# Patient Record
Sex: Female | Born: 1970 | Race: White | Hispanic: No | Marital: Single | Smoking: Never smoker
Health system: Southern US, Community
[De-identification: ages and names within clinical notes are randomized; demographics above are authoritative.]

## PROBLEM LIST (undated history)

## (undated) DIAGNOSIS — R5381 Other malaise: Secondary | ICD-10-CM

## (undated) DIAGNOSIS — N63 Unspecified lump in unspecified breast: Secondary | ICD-10-CM

## (undated) DIAGNOSIS — T7840XA Allergy, unspecified, initial encounter: Secondary | ICD-10-CM

## (undated) DIAGNOSIS — G43909 Migraine, unspecified, not intractable, without status migrainosus: Secondary | ICD-10-CM

## (undated) DIAGNOSIS — E559 Vitamin D deficiency, unspecified: Secondary | ICD-10-CM

## (undated) DIAGNOSIS — R519 Headache, unspecified: Secondary | ICD-10-CM

## (undated) DIAGNOSIS — L309 Dermatitis, unspecified: Secondary | ICD-10-CM

## (undated) DIAGNOSIS — F329 Major depressive disorder, single episode, unspecified: Secondary | ICD-10-CM

## (undated) DIAGNOSIS — J309 Allergic rhinitis, unspecified: Secondary | ICD-10-CM

## (undated) DIAGNOSIS — Z8489 Family history of other specified conditions: Secondary | ICD-10-CM

## (undated) DIAGNOSIS — B019 Varicella without complication: Secondary | ICD-10-CM

## (undated) DIAGNOSIS — R51 Headache: Secondary | ICD-10-CM

## (undated) HISTORY — DX: Headache: R51

## (undated) HISTORY — PX: WISDOM TOOTH EXTRACTION: SHX21

## (undated) HISTORY — DX: Unspecified lump in unspecified breast: N63.0

## (undated) HISTORY — DX: Major depressive disorder, single episode, unspecified: F32.9

## (undated) HISTORY — DX: Allergy, unspecified, initial encounter: T78.40XA

## (undated) HISTORY — DX: Allergic rhinitis, unspecified: J30.9

## (undated) HISTORY — DX: Migraine, unspecified, not intractable, without status migrainosus: G43.909

## (undated) HISTORY — DX: Vitamin D deficiency, unspecified: E55.9

## (undated) HISTORY — DX: Other malaise: R53.81

## (undated) HISTORY — DX: Varicella without complication: B01.9

## (undated) HISTORY — DX: Dermatitis, unspecified: L30.9

## (undated) HISTORY — DX: Headache, unspecified: R51.9

## (undated) SURGERY — EGD (ESOPHAGOGASTRODUODENOSCOPY)
Anesthesia: Monitor Anesthesia Care

---

## 2016-07-25 ENCOUNTER — Other Ambulatory Visit (HOSPITAL_COMMUNITY)
Admission: RE | Admit: 2016-07-25 | Discharge: 2016-07-25 | Disposition: A | Discharge status: Home / Self Care | Payer: Managed Care, Other (non HMO) | Source: Ambulatory Visit | Attending: Family Medicine | Admitting: Family Medicine

## 2016-07-25 ENCOUNTER — Encounter: Payer: Self-pay | Admitting: Family Medicine

## 2016-07-25 ENCOUNTER — Ambulatory Visit (INDEPENDENT_AMBULATORY_CARE_PROVIDER_SITE_OTHER): Payer: Managed Care, Other (non HMO) | Admitting: Family Medicine

## 2016-07-25 VITALS — BP 113/75 | HR 66 | Temp 98.0°F | Resp 20 | Ht 67.0 in | Wt 122.5 lb

## 2016-07-25 DIAGNOSIS — Z7189 Other specified counseling: Secondary | ICD-10-CM | POA: Diagnosis not present

## 2016-07-25 DIAGNOSIS — Z202 Contact with and (suspected) exposure to infections with a predominantly sexual mode of transmission: Secondary | ICD-10-CM | POA: Diagnosis not present

## 2016-07-25 DIAGNOSIS — Z130 Encounter for screening for diseases of the blood and blood-forming organs and certain disorders involving the immune mechanism: Secondary | ICD-10-CM | POA: Diagnosis not present

## 2016-07-25 DIAGNOSIS — Z1329 Encounter for screening for other suspected endocrine disorder: Secondary | ICD-10-CM | POA: Diagnosis not present

## 2016-07-25 DIAGNOSIS — Z113 Encounter for screening for infections with a predominantly sexual mode of transmission: Secondary | ICD-10-CM | POA: Diagnosis not present

## 2016-07-25 DIAGNOSIS — Z8262 Family history of osteoporosis: Secondary | ICD-10-CM

## 2016-07-25 DIAGNOSIS — N760 Acute vaginitis: Secondary | ICD-10-CM | POA: Insufficient documentation

## 2016-07-25 DIAGNOSIS — Z13220 Encounter for screening for lipoid disorders: Secondary | ICD-10-CM

## 2016-07-25 DIAGNOSIS — Z131 Encounter for screening for diabetes mellitus: Secondary | ICD-10-CM | POA: Diagnosis not present

## 2016-07-25 DIAGNOSIS — Z1231 Encounter for screening mammogram for malignant neoplasm of breast: Secondary | ICD-10-CM

## 2016-07-25 DIAGNOSIS — Z7689 Persons encountering health services in other specified circumstances: Secondary | ICD-10-CM

## 2016-07-25 DIAGNOSIS — E559 Vitamin D deficiency, unspecified: Secondary | ICD-10-CM | POA: Diagnosis not present

## 2016-07-25 LAB — CBC WITH DIFFERENTIAL/PLATELET
BASOS ABS: 0.1 10*3/uL (ref 0.0–0.1)
BASOS PCT: 1.6 % (ref 0.0–3.0)
EOS ABS: 0.1 10*3/uL (ref 0.0–0.7)
Eosinophils Relative: 2.8 % (ref 0.0–5.0)
HEMATOCRIT: 40.1 % (ref 36.0–46.0)
HEMOGLOBIN: 13.6 g/dL (ref 12.0–15.0)
LYMPHS PCT: 42.2 % (ref 12.0–46.0)
Lymphs Abs: 1.7 10*3/uL (ref 0.7–4.0)
MCHC: 33.8 g/dL (ref 30.0–36.0)
MCV: 95.1 fl (ref 78.0–100.0)
MONOS PCT: 9.1 % (ref 3.0–12.0)
Monocytes Absolute: 0.4 10*3/uL (ref 0.1–1.0)
Neutro Abs: 1.8 10*3/uL (ref 1.4–7.7)
Neutrophils Relative %: 44.3 % (ref 43.0–77.0)
Platelets: 287 10*3/uL (ref 150.0–400.0)
RBC: 4.22 Mil/uL (ref 3.87–5.11)
RDW: 12.5 % (ref 11.5–15.5)
WBC: 4.1 10*3/uL (ref 4.0–10.5)

## 2016-07-25 LAB — COMPREHENSIVE METABOLIC PANEL
ALBUMIN: 4.7 g/dL (ref 3.5–5.2)
ALK PHOS: 39 U/L (ref 39–117)
ALT: 15 U/L (ref 0–35)
AST: 18 U/L (ref 0–37)
BILIRUBIN TOTAL: 0.7 mg/dL (ref 0.2–1.2)
BUN: 10 mg/dL (ref 6–23)
CALCIUM: 9.1 mg/dL (ref 8.4–10.5)
CO2: 31 mEq/L (ref 19–32)
CREATININE: 0.72 mg/dL (ref 0.40–1.20)
Chloride: 103 mEq/L (ref 96–112)
GFR: 93.74 mL/min (ref >60.00)
Glucose, Bld: 89 mg/dL (ref 70–99)
Potassium: 4.3 mEq/L (ref 3.5–5.1)
Sodium: 139 mEq/L (ref 135–145)
Total Protein: 7.2 g/dL (ref 6.0–8.3)

## 2016-07-25 LAB — TSH: TSH: 1.29 u[IU]/mL (ref 0.35–4.50)

## 2016-07-25 LAB — HIV ANTIBODY (ROUTINE TESTING W REFLEX): HIV 1&2 Ab, 4th Generation: NONREACTIVE

## 2016-07-25 LAB — LIPID PANEL
CHOLESTEROL: 189 mg/dL (ref 0–200)
HDL: 87.3 mg/dL (ref >39.00)
LDL Cholesterol: 90 mg/dL (ref 0–99)
NonHDL: 101.52
Total CHOL/HDL Ratio: 2
Triglycerides: 60 mg/dL (ref 0.0–149.0)
VLDL: 12 mg/dL (ref 0.0–40.0)

## 2016-07-25 LAB — VITAMIN D 25 HYDROXY (VIT D DEFICIENCY, FRACTURES): VITD: 44.81 ng/mL (ref 30.00–100.00)

## 2016-07-25 LAB — HEMOGLOBIN A1C: Hgb A1c MFr Bld: 5.5 % (ref 4.6–6.5)

## 2016-07-25 NOTE — Progress Notes (Signed)
Patient ID: Harlem Thresher, female  DOB: [birth year], 45 y.o.   MRN: [id number] Patient Care Team    Relationship Specialty Notifications Start End  Ma Hillock, DO PCP - General Family Medicine  07/25/16     Subjective:  Charliegh Vasudevan is a 45 y.o.  female present for new patient establishment with acute complaint. All past medical history, surgical history, allergies, family history, immunizations, medications and social history were obtained in the electronic medical record today. All recent labs, ED visits and hospitalizations within the last year were reviewed.  Potential STD exposure: Pt states she went outside of her marriage about 2 months ago. She and her husband are working through this problem, but she wants to be certain she was not exposed to STD. Her husband is asking for reassurance before moving forward in the relationship. She denies abd pain, vaginal discharge, irritation or vaginal lesions.    Health maintenance:  Colonoscopy:no fhx colon cancer, screen at 49.  Mammogram: never mammogram. 2012 lump left breast by [place] was nursing, no follow up.   Cervical cancer screening: last pap 09/2014, abnormal once early in life, all normal since.  Immunizations: tdap 2014, Influenza  (encouraged yearly). Infectious disease screening: HIV  2014 DEXA: n/a, mother with osteoporosis    There is no immunization history on file for this patient.   Past Medical History:  Diagnosis Date  . Allergy   . Chicken pox   . Migraines    No Known Allergies History reviewed. No pertinent surgical history. Family History  Problem Relation Age of Onset  . Cancer Mother   . Hearing loss Mother   . Alcohol abuse Father   . Arthritis Father   . Cancer Father   . Diabetes Father   . Hearing loss Father   . Heart disease Father   . Alcohol abuse Maternal Aunt   . Alcohol abuse Maternal Uncle   . Alcohol abuse Paternal Uncle    Social History   Social History  . Marital status:  Single    Spouse name: N/A  . Number of children: N/A  . Years of education: N/A   Occupational History  . Not on file.   Social History Main Topics  . Smoking status: Never Smoker  . Smokeless tobacco: Never Used  . Alcohol use 1.2 oz/week    2 Glasses of wine per week  . Drug use: No  . Sexual activity: Yes    Birth control/ protection: Surgical     Comment: partner had vasectomy   Other Topics Concern  . Not on file   Social History Narrative  . No narrative on file     Medication List       Accurate as of 07/25/16  9:40 AM. Always use your most recent med list.          cetirizine 10 MG tablet Commonly known as:  ZYRTEC Take 10 mg by mouth daily as needed.   loratadine 10 MG tablet Commonly known as:  CLARITIN Take 10 mg by mouth daily as needed.        No results found for this or any previous visit (from the past 2160 hour(s)).  Patient was never admitted.   ROS: 14 pt review of systems performed and negative (unless mentioned in an HPI)  Objective: BP 113/75 (BP Location: Right Arm, Patient Position: Sitting, Cuff Size: Normal)   Pulse 66   Temp 98 F (36.7 C)   Resp 20  Ht _0  (1.702 m)   Wt 122 lb 8 oz (55.6 kg)   LMP 07/15/2016 (Approximate)   SpO2 100%   BMI 19.19 kg/m  Gen: Afebrile. No acute distress. Nontoxic in appearance, well-developed, well-nourished,  Pleasant, caucasian female.  HENT: AT. [place]. MMM.  Eyes:Pupils Equal Round Reactive to light, Extraocular movements intact,  Conjunctiva without redness, discharge or icterus. Neck/lymp/endocrine: Supple, No lymphadenopathy, no thyromegaly CV: RRR, no edema, +2/4 P posterior tibialis pulses.  Chest: CTAB, no wheeze, rhonchi or crackles.  Abd: Soft.NTND. BS present.  Skin: no rashes, purpura or petechiae. Warm and well-perfused. Skin intact. Neuro/Msk:  Normal gait. PERLA. EOMi. Alert. Oriented x3.  Psych: Normal affect, dress and demeanor. Normal speech. Normal thought content  and judgment.  Assessment/plan: Corinn Stoltzfus is a 45 y.o. female present for establish care with acute complaint.  Potential exposure to STD - referral to counseling offered. Lengthy discussion on signs/symptoms and testing of STDs.  - Urine cytology ancillary only - HSV(herpes smplx)abs-1+2(IgG+IgM)-bld - HIV antibody (with reflex) - RPR - Hepatitis, Acute - Comp Met (CMET)  Preventive exam labs ordered for next appt.  Thyroid disorder screen - Comp Met (CMET) - TSH Screening for deficiency anemia - CBC w/Diff Screening cholesterol level - Lipid panel Diabetes mellitus screening - HgB A1c FH: osteoporosis/Vit d deficiency - Vitamin D (25 hydroxy)  Encounter for screening mammogram for breast cancer - mass on prior US on 2012, report faxed with order.  - MM DIGITAL SCREENING BILATERAL; Future  F/U 2 weeks for preventive exam and review of above labs.    Electronically signed by: Howard Pouch, DO [place]

## 2016-07-25 NOTE — Patient Instructions (Signed)
Make an appt in 2-3 weeks for CPE and we will discuss all lab results at that time.  I have ordered a mammogram for follow up on prior US.  It was a pleasure meeting you today.

## 2016-07-26 LAB — HSV(HERPES SMPLX)ABS-I+II(IGG+IGM)-BLD
HERPES SIMPLEX VRS I-IGM AB (EIA): 1.19 {index} — AB
HSV 2 Glycoprotein G Ab, IgG: 10.3 Index — ABNORMAL HIGH (ref <0.90)

## 2016-07-26 LAB — URINE CYTOLOGY ANCILLARY ONLY
CHLAMYDIA, DNA PROBE: NEGATIVE
NEISSERIA GONORRHEA: NEGATIVE
Trichomonas: NEGATIVE

## 2016-07-26 LAB — HEPATITIS PANEL, ACUTE
HCV AB: NEGATIVE
HEP B C IGM: NONREACTIVE
Hep A IgM: NONREACTIVE
Hepatitis B Surface Ag: NEGATIVE

## 2016-07-26 LAB — RPR

## 2016-07-27 ENCOUNTER — Encounter: Payer: Self-pay | Admitting: Family Medicine

## 2016-07-29 LAB — URINE CYTOLOGY ANCILLARY ONLY
Bacterial vaginitis: POSITIVE — AB
Candida vaginitis: NEGATIVE

## 2016-07-31 ENCOUNTER — Telehealth: Payer: Self-pay | Admitting: Family Medicine

## 2016-07-31 MED ORDER — METRONIDAZOLE 500 MG PO TABS: 500.0000 mg | ORAL_TABLET | Freq: Two times a day (BID) | ORAL | 0 refills | Status: DC

## 2016-07-31 NOTE — Telephone Encounter (Signed)
Please call pt: - her labs are almost all resolved, I suspect the last will be resolved by her appt on Friday and we will go over all in detail, in person.  - One of her results did show BV (bacteria vaginosis). This is NOT a sexually transmitted disease, just overgrowth of bacteria. Not contagious.  - I have called in flagyl for 7 days which is treatment. This medicine can cause nausea. No alcohol use with medicine.

## 2016-07-31 NOTE — Telephone Encounter (Signed)
Left message for patient to return call.

## 2016-08-01 NOTE — Telephone Encounter (Signed)
Spoke with patient reviewed lab results and instructions. Patient verbalized understanding. 

## 2016-08-03 ENCOUNTER — Encounter: Payer: Self-pay | Admitting: Family Medicine

## 2016-08-03 ENCOUNTER — Ambulatory Visit (INDEPENDENT_AMBULATORY_CARE_PROVIDER_SITE_OTHER): Payer: Managed Care, Other (non HMO) | Admitting: Family Medicine

## 2016-08-03 VITALS — BP 109/72 | HR 69 | Temp 98.8°F | Resp 20 | Ht 67.0 in | Wt 123.5 lb

## 2016-08-03 DIAGNOSIS — I8390 Asymptomatic varicose veins of unspecified lower extremity: Secondary | ICD-10-CM | POA: Insufficient documentation

## 2016-08-03 DIAGNOSIS — Z1159 Encounter for screening for other viral diseases: Secondary | ICD-10-CM | POA: Diagnosis not present

## 2016-08-03 DIAGNOSIS — Z822 Family history of deafness and hearing loss: Secondary | ICD-10-CM

## 2016-08-03 DIAGNOSIS — Z0001 Encounter for general adult medical examination with abnormal findings: Secondary | ICD-10-CM | POA: Insufficient documentation

## 2016-08-03 DIAGNOSIS — A499 Bacterial infection, unspecified: Secondary | ICD-10-CM

## 2016-08-03 DIAGNOSIS — L309 Dermatitis, unspecified: Secondary | ICD-10-CM | POA: Insufficient documentation

## 2016-08-03 DIAGNOSIS — R6889 Other general symptoms and signs: Secondary | ICD-10-CM | POA: Diagnosis not present

## 2016-08-03 DIAGNOSIS — N760 Acute vaginitis: Secondary | ICD-10-CM

## 2016-08-03 DIAGNOSIS — I8393 Asymptomatic varicose veins of bilateral lower extremities: Secondary | ICD-10-CM | POA: Diagnosis not present

## 2016-08-03 DIAGNOSIS — B9689 Other specified bacterial agents as the cause of diseases classified elsewhere: Secondary | ICD-10-CM

## 2016-08-03 DIAGNOSIS — B009 Herpesviral infection, unspecified: Secondary | ICD-10-CM | POA: Insufficient documentation

## 2016-08-03 HISTORY — DX: Family history of deafness and hearing loss: Z82.2

## 2016-08-03 MED ORDER — VALACYCLOVIR HCL 500 MG PO TABS: 500.0000 mg | ORAL_TABLET | Freq: Every day | ORAL | 3 refills | Status: DC

## 2016-08-03 MED ORDER — TRIAMCINOLONE ACETONIDE 0.1 % EX CREA: 1.0000 "application " | TOPICAL_CREAM | Freq: Two times a day (BID) | CUTANEOUS | 0 refills | Status: DC

## 2016-08-03 MED ORDER — VALACYCLOVIR HCL 1 G PO TABS: 1000.0000 mg | ORAL_TABLET | Freq: Two times a day (BID) | ORAL | 0 refills | Status: DC

## 2016-08-03 NOTE — Progress Notes (Signed)
Patient ID: Darlene Benton, female  DOB: 10/25/1971, 45 y.o.   MRN: [id number] Patient Care Team    Relationship Specialty Notifications Start End  Ma Hillock, DO PCP - General Family Medicine  07/25/16     Subjective:  Darlene Benton is a 45 y.o.  Female  present for CPE. All past medical history, surgical history, allergies, family history, immunizations, medications and social history were updated in the electronic medical record today. All recent labs, ED visits and hospitalizations within the last year were reviewed.   Health maintenance:  Colonoscopy:no fhx colon cancer, screen at 40.  Mammogram: never mammogram. 2012 lump left breast by [place] was nursing, no follow up.  Mammogram has been ordered and she is encouraged to schedule. Cervical cancer screening: last pap 09/2014, abnormal once early in life, all normal since. Due 2018. Immunizations: tdap 2014, Influenza declined today(encouraged yearly). Infectious disease screening: HIV  2014 DEXA: n/a, mother with osteoporosis  Assistive device: none Oxygen HAL:PFXT Patient has a Dental home. Hospitalizations/ED visits: none  Immunization History  Administered Date(s) Administered  . Tdap 11/26/2012     Past Medical History:  Diagnosis Date  . Allergic rhinitis   . Allergy   . Breast mass   . Chicken pox   . Eczema   . Headache    Hospitalize, right parotid questionable enlargement, MRI/CT and ultrasound completed was believed to be normal.  . Major depression ([place])   . Malaise   . Migraines   . Vitamin D deficiency    No Known Allergies Past Surgical History:  Procedure Laterality Date  . WISDOM TOOTH EXTRACTION     Family History  Problem Relation Age of Onset  . Cancer Mother     adenoid  cystic carcinoma   . Osteoporosis Mother   . Osteosclerosis Mother     hearing loss in early 68s  . Alcohol abuse Father   . Arthritis Father   . Cancer Father   . Diabetes Father   . Hearing loss Father   .  Heart disease Father   . Alcohol abuse Maternal Aunt   . Alcohol abuse Maternal Uncle   . Alcohol abuse Paternal Uncle    Social History   Social History  . Marital status: Single    Spouse name: N/A  . Number of children: N/A  . Years of education: N/A   Occupational History  . Not on file.   Social History Main Topics  . Smoking status: Never Smoker  . Smokeless tobacco: Never Used  . Alcohol use 1.2 oz/week    2 Glasses of wine per week  . Drug use: No  . Sexual activity: Yes    Birth control/ protection: Surgical     Comment: partner had vasectomy   Other Topics Concern  . Not on file   Social History Narrative   Partner Delorise Shiner, 2 children [place] and [place]   BA. Marketing.    Caffeine use. Herbal use. Daily vitamin.    Wears her seatbelt, bicycle helmet, smoke detector in the home.    Exercises routinely.    Feels safe in her relationships.         Medication List       Accurate as of 08/03/16  7:35 PM. Always use your most recent med list.          cetirizine 10 MG tablet Commonly known as:  ZYRTEC Take 10 mg by mouth daily as needed.   loratadine 10 MG  tablet Commonly known as:  CLARITIN Take 10 mg by mouth daily as needed.   metroNIDAZOLE 500 MG tablet Commonly known as:  FLAGYL Take 1 tablet (500 mg total) by mouth 2 (two) times daily.   triamcinolone cream 0.1 % Commonly known as:  KENALOG Apply 1 application topically 2 (two) times daily.   valACYclovir 1000 MG tablet Commonly known as:  VALTREX Take 1 tablet (1,000 mg total) by mouth 2 (two) times daily.   valACYclovir 500 MG tablet Commonly known as:  VALTREX Take 1 tablet (500 mg total) by mouth daily.        Recent Results (from the past 2160 hour(s))  Urine cytology ancillary only     Status: None   Collection Time: 07/25/16 12:00 AM  Result Value Ref Range   Chlamydia Negative     Comment: Normal Reference Range - Negative   Neisseria gonorrhea Negative     Comment:  Normal Reference Range - Negative   Trichomonas Negative     Comment: Normal Reference Range - Negative  Urine cytology ancillary only     Status: Abnormal   Collection Time: 07/25/16 12:00 AM  Result Value Ref Range   Bacterial vaginitis **POSITIVE for Gardnerella vaginalis** (A)     Comment: Normal Reference Range - Negative   Candida vaginitis Negative for Candida Vaginitis Microorganisms     Comment: Normal Reference Range - Negative  HSV(herpes smplx)abs-1+2(IgG+IgM)-bld     Status: Abnormal   Collection Time: 07/25/16 10:15 AM  Result Value Ref Range   HSV 1 Glycoprotein G Ab, IgG <0.90 <0.90 Index    Comment:                      Index             Interpretation                    =====             ==============                    < 0.90               NEGATIVE                    0.90 - 1.09          EQUIVOCAL                    > 1.09               POSITIVE    HSV 2 Glycoprotein G Ab, IgG 10.30 (H) <0.90 Index    Comment:                      Index             Interpretation                    =====             ==============                    < 0.90               NEGATIVE                    0.90 - 1.09  EQUIVOCAL                    > 1.09               POSITIVE This assay utilizes recombinant type-specific antigens to differentiate HSV-1 from HSV-2 infections. A positive result cannot distinguish between recent and past infection. If recent HSV infection is suspected but the results are negative or equivocal, the assay should be repeated in 4-6 weeks. The performance characteristics of the assay have not been established for pediatric populations, immunocompromised patients, or neonatal screening.      Herpes Simplex Vrs I&II-IgM Ab (EIA) 1.19 (H) INDEX    Comment:                 <=0.90 . . . . . . Marland Kitchen NEGATIVE               0.91-1.09. . . . . Marland Kitchen EQUIVOCAL               >=1.10 . . . . . . Marland Kitchen POSITIVE                                                                                                    The results obtained with the HSV 1 & 2 IgM test should serve only as an aid to diagnosis and should not be interpreted as diagnostic by themselves. Heterotypic IgM antibody responses may occur in patients with a history of infection with other Herpes viruses, including Epstein-Barr virus and Varicella zoster virus, and give false positive results in HSV 1 & 2 IgM tests.  This test does not distinguish between HSV 1 and HSV 2.  A positive HSV IgM may indicate primary infection, but IgM antibody can persist 12 or more months after primary infection.  For confirmation, if clinically indicated, positive IgM results could be followed with the test for HSV 1 & 2 IgG glycoprotein (test code [phone]) in 4-6 weeks.   HIV antibody (with reflex)     Status: None   Collection Time: 07/25/16 10:15 AM  Result Value Ref Range   HIV 1&2 Ab, 4th Generation NONREACTIVE NONREACTIVE    Comment:   HIV-1 antigen and HIV-1/HIV-2 antibodies were not detected.  There is no laboratory evidence of HIV infection.   HIV-1/2 Antibody Diff        Not indicated. HIV-1 RNA, Qual TMA          Not indicated.     PLEASE NOTE: This information has been disclosed to you from records whose confidentiality may be protected by state law. If your state requires such protection, then the state law prohibits you from making any further disclosure of the information without the specific written consent of the person to whom it pertains, or as otherwise permitted by law. A general authorization for the release of medical or other information is NOT sufficient for this purpose.   The performance of this assay has not been clinically validated in patients less than 36 years old.   For additional information please refer to http://education.[facility].com/faq/FAQ106.  (  This link is being provided for informational/educational purposes only.)     RPR     Status: None   Collection  Time: 07/25/16 10:15 AM  Result Value Ref Range   RPR Ser Ql NON REAC NON REAC  Hepatitis, Acute     Status: None   Collection Time: 07/25/16 10:15 AM  Result Value Ref Range   Hepatitis B Surface Ag NEGATIVE NEGATIVE   HCV Ab NEGATIVE NEGATIVE   Hep B C IgM NON REACTIVE NON REACTIVE    Comment: High levels of Hepatitis B Core IgM antibody are detectable during the acute stage of Hepatitis B. This antibody is used to differentiate current from past HBV infection.      Hep A IgM NON REACTIVE NON REACTIVE    Comment:   Effective October 11, 2014, Hepatitis Acute Panel (test code [phone]) will be revised to automatically reflex to the Hepatitis C Viral RNA, Quantitative, Real-Time PCR assay if the Hepatitis C antibody screening result is Reactive. This action is being taken to ensure that the CDC/USPSTF recommended HCV diagnostic algorithm with the appropriate test reflex needed for accurate interpretation is followed.     CBC w/Diff     Status: None   Collection Time: 07/25/16 10:15 AM  Result Value Ref Range   WBC 4.1 4.0 - 10.5 K/uL   RBC 4.22 3.87 - 5.11 Mil/uL   Hemoglobin 13.6 12.0 - 15.0 g/dL   HCT 40.1 36.0 - 46.0 %   MCV 95.1 78.0 - 100.0 fl   MCHC 33.8 30.0 - 36.0 g/dL   RDW 12.5 11.5 - 15.5 %   Platelets 287.0 150.0 - 400.0 K/uL   Neutrophils Relative % 44.3 43.0 - 77.0 %   Lymphocytes Relative 42.2 12.0 - 46.0 %   Monocytes Relative 9.1 3.0 - 12.0 %   Eosinophils Relative 2.8 0.0 - 5.0 %   Basophils Relative 1.6 0.0 - 3.0 %   Neutro Abs 1.8 1.4 - 7.7 K/uL   Lymphs Abs 1.7 0.7 - 4.0 K/uL   Monocytes Absolute 0.4 0.1 - 1.0 K/uL   Eosinophils Absolute 0.1 0.0 - 0.7 K/uL   Basophils Absolute 0.1 0.0 - 0.1 K/uL  Comp Met (CMET)     Status: None   Collection Time: 07/25/16 10:15 AM  Result Value Ref Range   Sodium 139 135 - 145 mEq/L   Potassium 4.3 3.5 - 5.1 mEq/L   Chloride 103 96 - 112 mEq/L   CO2 31 19 - 32 mEq/L   Glucose, Bld 89 70 - 99 mg/dL   BUN 10 6 -  23 mg/dL   Creatinine, Ser 0.72 0.40 - 1.20 mg/dL   Total Bilirubin 0.7 0.2 - 1.2 mg/dL   Alkaline Phosphatase 39 39 - 117 U/L   AST 18 0 - 37 U/L   ALT 15 0 - 35 U/L   Total Protein 7.2 6.0 - 8.3 g/dL   Albumin 4.7 3.5 - 5.2 g/dL   Calcium 9.1 8.4 - 10.5 mg/dL   GFR 93.74 >60.00 mL/min  TSH     Status: None   Collection Time: 07/25/16 10:15 AM  Result Value Ref Range   TSH 1.29 0.35 - 4.50 uIU/mL  Lipid panel     Status: None   Collection Time: 07/25/16 10:15 AM  Result Value Ref Range   Cholesterol 189 0 - 200 mg/dL    Comment: ATP III Classification       Desirable:  < 200 mg/dL  Borderline High:  200 - 239 mg/dL          High:  > = 240 mg/dL   Triglycerides 60.0 0.0 - 149.0 mg/dL    Comment: Normal:  <150 mg/dLBorderline High:  150 - 199 mg/dL   HDL 87.30 >39.00 mg/dL   VLDL 12.0 0.0 - 40.0 mg/dL   LDL Cholesterol 90 0 - 99 mg/dL   Total CHOL/HDL Ratio 2     Comment:                Men          Women1/2 Average Risk     3.4          3.3Average Risk          5.0          4.42X Average Risk          9.6          7.13X Average Risk          15.0          11.0                       NonHDL 101.52     Comment: NOTE:  Non-HDL goal should be 30 mg/dL higher than patient's LDL goal (i.e. LDL goal of < 70 mg/dL, would have non-HDL goal of < 100 mg/dL)  HgB A1c     Status: None   Collection Time: 07/25/16 10:15 AM  Result Value Ref Range   Hgb A1c MFr Bld 5.5 4.6 - 6.5 %    Comment: Glycemic Control Guidelines for People with Diabetes:Non Diabetic:  <6%Goal of Therapy: <7%Additional Action Suggested:  >8%   Vitamin D (25 hydroxy)     Status: None   Collection Time: 07/25/16 10:15 AM  Result Value Ref Range   VITD 44.81 30.00 - 100.00 ng/mL    No results found.   ROS: 14 pt review of systems performed and negative (unless mentioned in an HPI)  Objective: BP 109/72 (BP Location: Right Arm, Patient Position: Sitting, Cuff Size: Normal)   Pulse 69   Temp 98.8 F (37.1  C)   Resp 20   Ht _0  (1.702 m)   Wt 123 lb 8 oz (56 kg)   LMP 07/15/2016 (Approximate)   SpO2 98%   BMI 19.34 kg/m  Gen: Afebrile. No acute distress. Nontoxic in appearance, well-developed, well-nourished,  Pleasant Caucasian female. HENT: AT. [place]. Bilateral TM visualized and normal in appearance, normal external auditory canal. MMM, no oral lesions, adequate dentition. Bilateral nares within normal limits. Throat without without erythema, ulcerations or exudates. No Cough on exam, no hoarseness on exam. Eyes:Pupils Equal Round Reactive to light, Extraocular movements intact,  Conjunctiva without redness, discharge or icterus. Neck/lymp/endocrine: Supple, no lymphadenopathy, no thyromegaly CV: RRR no murmur appreciated, no edema, +2/4 P posterior tibialis pulses.  Chest: CTAB, no wheeze, rhonchi or crackles. Normal Respiratory effort. Good Air movement. Abd: Soft. Flat. NTND. BS present. No Masses palpated. No hepatosplenomegaly. No rebound tenderness or guarding. Skin: No rashes, purpura or petechiae. Warm and well-perfused. Skin intact. Neuro/Msk:  Normal gait. PERLA. EOMi. Alert. Oriented x3.  Cranial nerves II through XII intact. Muscle strength 5/5 upper/lower extremity. DTRs equal bilaterally. Psych: Normal affect, dress and demeanor. Normal speech. Normal thought content and judgment.   Hearing Screening   Method: Audiometry   _1  _2  _3  _4  _5  _6  _7  _8  _9   Right ear:  _0 Left ear:   _1 Assessment/plan: Darlene Benton is a 45 y.o. female present for CPE without PAP.  Encounter for general adult medical examination with abnormal findings All labs reviewed with patient in detail, her labs are normal outside the HSV-2. Colonoscopy:no fhx colon cancer, screen at 89.  Mammogram: never mammogram. 2012 lump left breast by [place] was nursing, no follow up.  Mammogram has been ordered and she is encouraged to schedule. Cervical  cancer screening: last pap 09/2014, abnormal once early in life, all normal since. Due 2018. Immunizations: tdap 2014, Influenza declined today(encouraged yearly). Infectious disease screening: HIV  2014 DEXA: n/a, mother with osteoporosis  AVS was provided on health maintenance and HSV-2. Patient was encouraged to exercise greater than 150 minutes a week, it diet low in saturated fat, full  in vegetables, fruits and lean meats. STD exposure: - Results from last office visit were reviewed in detail today. Patient is HSV 2 positive IgG. Her IgM is equivocal. Discussed with her the meaning of these labs and it is unable for me to predict when she was exposed. Considering IgM is equivocal could be new exposure with new IgG development, could also just be an equivocal IgM and she has been IgG positive for years. Extensive review of past medical history, resulted with patient ever being tested for HSV or at least documented in the past. Advised patient on her options. She is married. She did recently go outside of her marriage. Discussed with her since she is HSV 2 positive and we are uncertain of timing, I would advise to treat this new infection despite no lesions, with suppression therapy to follow to attempt to decrease possibly new viral load. I will discuss with her husband the results and if they decide they could be tested again in 3 months, he had recently been tested weeks ago and was negative. I discussed with her in great detail we may never have any more answers then we do currently by retesting, but it may put her mind at ease that she didn't expose her husband if he remains negative.  Eczema of both hands - Triamcinolone cream prescribed - Ambulatory referral to Allergy  Varicose vein of leg - Palpable left calf varicosity, it is painful to patient Will refer to vascular. - Discussed compression stockings. - Ambulatory referral to Vascular Surgery  BV (bacterial vaginosis) - Patient was  prescribed Flagyl, she has not picked this up yet. She was encouraged to start medication.  FH early hearing loss in mother: otosclerosis: - pt with normal hearing screen today.  - do not feel this needs to be completed yearly unless she reports hearing loss.    Return in about 1 year (around 08/03/2017) for CPE.  Electronically signed by: Howard Pouch, DO [facility]

## 2016-08-03 NOTE — Patient Instructions (Addendum)
Health Maintenance, Female Adopting a healthy lifestyle and getting preventive care can go a long way to promote health and wellness. Talk with your health care provider about what schedule of regular examinations is right for you. This is a good chance for you to check in with your provider about disease prevention and staying healthy. In between checkups, there are plenty of things you can do on your own. Experts have done a lot of research about which lifestyle changes and preventive measures are most likely to keep you healthy. Ask your health care provider for more information. WEIGHT AND DIET  Eat a healthy diet  Be sure to include plenty of vegetables, fruits, low-fat dairy products, and lean protein.  Do not eat a lot of foods high in solid fats, added sugars, or salt.  Get regular exercise. This is one of the most important things you can do for your health.  Most adults should exercise for at least 150 minutes each week. The exercise should increase your heart rate and make you sweat (moderate-intensity exercise).  Most adults should also do strengthening exercises at least twice a week. This is in addition to the moderate-intensity exercise.  Maintain a healthy weight  Body mass index (BMI) is a measurement that can be used to identify possible weight problems. It estimates body fat based on height and weight. Your health care provider can help determine your BMI and help you achieve or maintain a healthy weight.  For females 20 years of age and older:   A BMI below 18.5 is considered underweight.  A BMI of 18.5 to 24.9 is normal.  A BMI of 25 to 29.9 is considered overweight.  A BMI of 30 and above is considered obese.  Watch levels of cholesterol and blood lipids  You should start having your blood tested for lipids and cholesterol at 43 years of age, then have this test every 5 years.  You may need to have your cholesterol levels checked more often if:  Your lipid  or cholesterol levels are high.  You are older than 43 years of age.  You are at high risk for heart disease.  CANCER SCREENING   Lung Cancer  Lung cancer screening is recommended for adults 55-80 years old who are at high risk for lung cancer because of a history of smoking.  A yearly low-dose CT scan of the lungs is recommended for people who:  Currently smoke.  Have quit within the past 15 years.  Have at least a 30-pack-year history of smoking. A pack year is smoking an average of one pack of cigarettes a day for 1 year.  Yearly screening should continue until it has been 15 years since you quit.  Yearly screening should stop if you develop a health problem that would prevent you from having lung cancer treatment.  Breast Cancer  Practice breast self-awareness. This means understanding how your breasts normally appear and feel.  It also means doing regular breast self-exams. Let your health care provider know about any changes, no matter how small.  If you are in your 20s or 30s, you should have a clinical breast exam (CBE) by a health care provider every 1-3 years as part of a regular health exam.  If you are 40 or older, have a CBE every year. Also consider having a breast X-ray (mammogram) every year.  If you have a family history of breast cancer, talk to your health care provider about genetic screening.  If you   are at high risk for breast cancer, talk to your health care provider about having an MRI and a mammogram every year.  Breast cancer gene (BRCA) assessment is recommended for women who have family members with BRCA-related cancers. BRCA-related cancers include:  Breast.  Ovarian.  Tubal.  Peritoneal cancers.  Results of the assessment will determine the need for genetic counseling and BRCA1 and BRCA2 testing. Cervical Cancer Your health care provider may recommend that you be screened regularly for cancer of the pelvic organs (ovaries, uterus, and  vagina). This screening involves a pelvic examination, including checking for microscopic changes to the surface of your cervix (Pap test). You may be encouraged to have this screening done every 3 years, beginning at age 21.  For women ages 30-65, health care providers may recommend pelvic exams and Pap testing every 3 years, or they may recommend the Pap and pelvic exam, combined with testing for human papilloma virus (HPV), every 5 years. Some types of HPV increase your risk of cervical cancer. Testing for HPV may also be done on women of any age with unclear Pap test results.  Other health care providers may not recommend any screening for nonpregnant women who are considered low risk for pelvic cancer and who do not have symptoms. Ask your health care provider if a screening pelvic exam is right for you.  If you have had past treatment for cervical cancer or a condition that could lead to cancer, you need Pap tests and screening for cancer for at least 20 years after your treatment. If Pap tests have been discontinued, your risk factors (such as having a new sexual partner) need to be reassessed to determine if screening should resume. Some women have medical problems that increase the chance of getting cervical cancer. In these cases, your health care provider may recommend more frequent screening and Pap tests. Colorectal Cancer  This type of cancer can be detected and often prevented.  Routine colorectal cancer screening usually begins at 43 years of age and continues through 43 years of age.  Your health care provider may recommend screening at an earlier age if you have risk factors for colon cancer.  Your health care provider may also recommend using home test kits to check for hidden blood in the stool.  A small camera at the end of a tube can be used to examine your colon directly (sigmoidoscopy or colonoscopy). This is done to check for the earliest forms of colorectal  cancer.  Routine screening usually begins at age 50.  Direct examination of the colon should be repeated every 5-10 years through 43 years of age. However, you may need to be screened more often if early forms of precancerous polyps or small growths are found. Skin Cancer  Check your skin from head to toe regularly.  Tell your health care provider about any new moles or changes in moles, especially if there is a change in a mole's shape or color.  Also tell your health care provider if you have a mole that is larger than the size of a pencil eraser.  Always use sunscreen. Apply sunscreen liberally and repeatedly throughout the day.  Protect yourself by wearing long sleeves, pants, a wide-brimmed hat, and sunglasses whenever you are outside. HEART DISEASE, DIABETES, AND HIGH BLOOD PRESSURE   High blood pressure causes heart disease and increases the risk of stroke. High blood pressure is more likely to develop in:  People who have blood pressure in the high end   of the normal range (130-139/85-89 mm Hg).  People who are overweight or obese.  People who are African American.  If you are 38-23 years of age, have your blood pressure checked every 3-5 years. If you are 61 years of age or older, have your blood pressure checked every year. You should have your blood pressure measured twice--once when you are at a hospital or clinic, and once when you are not at a hospital or clinic. Record the average of the two measurements. To check your blood pressure when you are not at a hospital or clinic, you can use:  An automated blood pressure machine at a pharmacy.  A home blood pressure monitor.  If you are between 45 years and 39 years old, ask your health care provider if you should take aspirin to prevent strokes.  Have regular diabetes screenings. This involves taking a blood sample to check your fasting blood sugar level.  If you are at a normal weight and have a low risk for diabetes,  have this test once every three years after 43 years of age.  If you are overweight and have a high risk for diabetes, consider being tested at a younger age or more often. PREVENTING INFECTION  Hepatitis B  If you have a higher risk for hepatitis B, you should be screened for this virus. You are considered at high risk for hepatitis B if:  You were born in a country where hepatitis B is common. Ask your health care provider which countries are considered high risk.  Your parents were born in a high-risk country, and you have not been immunized against hepatitis B (hepatitis B vaccine).  You have HIV or AIDS.  You use needles to inject street drugs.  You live with someone who has hepatitis B.  You have had sex with someone who has hepatitis B.  You get hemodialysis treatment.  You take certain medicines for conditions, including cancer, organ transplantation, and autoimmune conditions. Hepatitis C  Blood testing is recommended for:  Everyone born from [birth year] through 1963.  Anyone with known risk factors for hepatitis C. Sexually transmitted infections (STIs)  You should be screened for sexually transmitted infections (STIs) including gonorrhea and chlamydia if:  You are sexually active and are younger than 43 years of age.  You are older than 43 years of age and your health care provider tells you that you are at risk for this type of infection.  Your sexual activity has changed since you were last screened and you are at an increased risk for chlamydia or gonorrhea. Ask your health care provider if you are at risk.  If you do not have HIV, but are at risk, it may be recommended that you take a prescription medicine daily to prevent HIV infection. This is called pre-exposure prophylaxis (PrEP). You are considered at risk if:  You are sexually active and do not regularly use condoms or know the HIV status of your partner(s).  You take drugs by injection.  You are sexually  active with a partner who has HIV. Talk with your health care provider about whether you are at high risk of being infected with HIV. If you choose to begin PrEP, you should first be tested for HIV. You should then be tested every 3 months for as long as you are taking PrEP.  PREGNANCY   If you are premenopausal and you may become pregnant, ask your health care provider about preconception counseling.  If you may  become pregnant, take 400 to 800 micrograms (mcg) of folic acid every day.  If you want to prevent pregnancy, talk to your health care provider about birth control (contraception). OSTEOPOROSIS AND MENOPAUSE   Osteoporosis is a disease in which the bones lose minerals and strength with aging. This can result in serious bone fractures. Your risk for osteoporosis can be identified using a bone density scan.  If you are 64 years of age or older, or if you are at risk for osteoporosis and fractures, ask your health care provider if you should be screened.  Ask your health care provider whether you should take a calcium or vitamin D supplement to lower your risk for osteoporosis.  Menopause may have certain physical symptoms and risks.  Hormone replacement therapy may reduce some of these symptoms and risks. Talk to your health care provider about whether hormone replacement therapy is right for you.  HOME CARE INSTRUCTIONS   Schedule regular health, dental, and eye exams.  Stay current with your immunizations.   Do not use any tobacco products including cigarettes, chewing tobacco, or electronic cigarettes.  If you are pregnant, do not drink alcohol.  If you are breastfeeding, limit how much and how often you drink alcohol.  Limit alcohol intake to no more than 1 drink per day for nonpregnant women. One drink equals 12 ounces of beer, 5 ounces of wine, or 1 ounces of hard liquor.  Do not use street drugs.  Do not share needles.  Ask your health care provider for help if  you need support or information about quitting drugs.  Tell your health care provider if you often feel depressed.  Tell your health care provider if you have ever been abused or do not feel safe at home.   This information is not intended to replace advice given to you by your health care provider. Make sure you discuss any questions you have with your health care provider.   Document Released: 05/28/2011 Document Revised: 12/03/2014 Document Reviewed: 10/14/2013 Elsevier Interactive Patient Education 2016 Elsevier Inc[employer]  Genital Herpes Genital herpes is a common sexually transmitted infection (STI) that is caused by a virus. The virus is spread from person to person through sexual contact. Infection can cause itching, blisters, and sores in the genital area or rectal area. This is called an outbreak. It affects both men and women. Genital herpes is particularly concerning for pregnant women because the virus can be passed to the baby during delivery and cause serious problems. Genital herpes is also a concern for people with a weakened defense (immune) system. Symptoms of genital herpes may last several days and then go away. However, the virus remains in your body, so you may have more outbreaks of symptoms in the future. The time between outbreaks varies and can be months or years. CAUSES Genital herpes is caused by a virus called herpes simplex virus (HSV) type 2 or HSV type 1. These viruses are contagious and are most often spread through sexual contact with an infected person. Sexual contact includes vaginal, anal, and oral sex. RISK FACTORS Risk factors for genital herpes include:  Being sexually active with multiple partners.  Having unprotected sex. SIGNS AND SYMPTOMS Symptoms may include:  Pain and itching in the genital area or rectal area.  Small red bumps that turn into blisters and then turn into sores.  Flu-like symptoms, including:  Fever.  Body aches.  Painful  urination.  Vaginal discharge. DIAGNOSIS Genital herpes may be diagnosed by:  Physical exam.  Blood test.  Fluid culture test from an open sore. TREATMENT There is no cure for genital herpes. Oral antiviral medicines may be used to speed up healing and to help prevent the return of symptoms. These medicines can also help to reduce the spread of the virus to sexual partners. HOME CARE INSTRUCTIONS  Keep the affected areas dry and clean.  Take medicines only as directed by your health care provider.  Do not have sexual contact during active infections. Genital herpes is contagious.  Practice safe sex. Latex condoms and female condoms may help to prevent the spread of the herpes virus.  Avoid rubbing or touching the blisters and sores. If you do touch the blister or sores:  Wash your hands thoroughly.  Do not touch your eyes afterward.  If you become pregnant, tell your health care provider if you have had genital herpes.  Keep all follow-up visits as directed by your health care provider. This is important. PREVENTION  Use condoms. Although anyone can contract genital herpes during sexual contact even with the use of a condom, a condom can provide some protection.  Avoid having multiple sexual partners.  Talk to your sexual partner about any symptoms and past history that either of you may have.  Get tested before you have sex. Ask your partner to do the same.  Recognize the symptoms of genital herpes. Do not have sexual contact if you notice these symptoms. SEEK MEDICAL CARE IF:  Your symptoms are not improving with medicine.  Your symptoms return.  You have new symptoms.  You have a fever.  You have abdominal pain.  You have redness, swelling, or pain in your eye. MAKE SURE YOU:  Understand these instructions.  Will watch your condition.  Will get help right away if you are not doing well or get worse.   This information is not intended to replace  advice given to you by your health care provider. Make sure you discuss any questions you have with your health care provider.   Document Released: 11/09/2000 Document Revised: 12/03/2014 Document Reviewed: 03/30/2014 Elsevier Interactive Patient Education 2016 [place] valtrex 1000 mg every 12 hours for 10 days, then start suppression of 1 tab daily ('500mg'$ ).  - if desired retest in 3 months.   Referral to vascular/vein and allergy today.  Please schedule mammogram.  If you feel you need to discuss anxiety, please make appt.   Otherwise yearly physical.

## 2016-08-07 ENCOUNTER — Encounter: Payer: Self-pay | Admitting: Family Medicine

## 2016-08-22 ENCOUNTER — Ambulatory Visit: Payer: Managed Care, Other (non HMO)

## 2016-08-22 ENCOUNTER — Ambulatory Visit
Admission: RE | Admit: 2016-08-22 | Discharge: 2016-08-22 | Disposition: A | Discharge status: Home / Self Care | Payer: Managed Care, Other (non HMO) | Source: Ambulatory Visit | Attending: Family Medicine | Admitting: Family Medicine

## 2016-08-22 ENCOUNTER — Other Ambulatory Visit: Payer: Self-pay | Admitting: Family Medicine

## 2016-08-22 DIAGNOSIS — N63 Unspecified lump in unspecified breast: Secondary | ICD-10-CM

## 2016-08-22 DIAGNOSIS — Z1231 Encounter for screening mammogram for malignant neoplasm of breast: Secondary | ICD-10-CM

## 2016-08-29 ENCOUNTER — Other Ambulatory Visit: Payer: Self-pay | Admitting: *Deleted

## 2016-08-29 DIAGNOSIS — I83812 Varicose veins of left lower extremities with pain: Secondary | ICD-10-CM

## 2016-08-31 ENCOUNTER — Encounter: Payer: Self-pay | Admitting: Vascular Surgery

## 2016-09-05 ENCOUNTER — Encounter (HOSPITAL_COMMUNITY): Payer: Managed Care, Other (non HMO)

## 2016-09-05 ENCOUNTER — Encounter: Payer: Managed Care, Other (non HMO) | Admitting: Vascular Surgery

## 2016-09-07 ENCOUNTER — Other Ambulatory Visit: Payer: Managed Care, Other (non HMO)

## 2016-09-20 ENCOUNTER — Other Ambulatory Visit: Payer: Self-pay | Admitting: Family Medicine

## 2016-09-20 ENCOUNTER — Ambulatory Visit
Admission: RE | Admit: 2016-09-20 | Discharge: 2016-09-20 | Disposition: A | Discharge status: Home / Self Care | Payer: Managed Care, Other (non HMO) | Source: Ambulatory Visit | Attending: Family Medicine | Admitting: Family Medicine

## 2016-09-20 DIAGNOSIS — N63 Unspecified lump in unspecified breast: Secondary | ICD-10-CM

## 2016-09-20 DIAGNOSIS — R921 Mammographic calcification found on diagnostic imaging of breast: Secondary | ICD-10-CM

## 2016-09-26 ENCOUNTER — Other Ambulatory Visit: Payer: Self-pay | Admitting: Family Medicine

## 2016-09-26 DIAGNOSIS — R921 Mammographic calcification found on diagnostic imaging of breast: Secondary | ICD-10-CM

## 2016-09-27 ENCOUNTER — Ambulatory Visit
Admission: RE | Admit: 2016-09-27 | Discharge: 2016-09-27 | Disposition: A | Discharge status: Home / Self Care | Payer: Managed Care, Other (non HMO) | Source: Ambulatory Visit | Attending: Family Medicine | Admitting: Family Medicine

## 2016-09-27 DIAGNOSIS — R921 Mammographic calcification found on diagnostic imaging of breast: Secondary | ICD-10-CM

## 2016-09-28 ENCOUNTER — Encounter: Payer: Self-pay | Admitting: Family Medicine

## 2016-09-28 DIAGNOSIS — R928 Other abnormal and inconclusive findings on diagnostic imaging of breast: Secondary | ICD-10-CM | POA: Insufficient documentation

## 2016-12-11 ENCOUNTER — Encounter: Payer: Self-pay | Admitting: Vascular Surgery

## 2016-12-14 ENCOUNTER — Ambulatory Visit (HOSPITAL_COMMUNITY)
Admission: RE | Admit: 2016-12-14 | Discharge: 2016-12-14 | Disposition: A | Discharge status: Home / Self Care | Payer: Managed Care, Other (non HMO) | Source: Ambulatory Visit | Attending: Vascular Surgery | Admitting: Vascular Surgery

## 2016-12-14 ENCOUNTER — Ambulatory Visit (INDEPENDENT_AMBULATORY_CARE_PROVIDER_SITE_OTHER): Payer: Managed Care, Other (non HMO) | Admitting: Vascular Surgery

## 2016-12-14 ENCOUNTER — Encounter: Payer: Self-pay | Admitting: Vascular Surgery

## 2016-12-14 VITALS — BP 108/65 | HR 71 | Temp 97.8°F | Resp 18 | Ht 67.0 in | Wt 130.3 lb

## 2016-12-14 DIAGNOSIS — I83812 Varicose veins of left lower extremities with pain: Secondary | ICD-10-CM | POA: Diagnosis not present

## 2016-12-14 DIAGNOSIS — I83893 Varicose veins of bilateral lower extremities with other complications: Secondary | ICD-10-CM

## 2016-12-14 DIAGNOSIS — I872 Venous insufficiency (chronic) (peripheral): Secondary | ICD-10-CM | POA: Diagnosis not present

## 2016-12-14 NOTE — Progress Notes (Signed)
Referred by:  Natalia Leatherwood, DO [address] [place], [place] [address]  Reason for referral: left leg painful varicose vein   History of Present Illness  Darlene Benton is a 46 y.o. (Nov 23, 1971) female who presents with chief complaint: painful left varicose veins.  Patient increasing skin sensitivity overlying her left leg vein.  She gets mild-moderate, sharp to aching pain in L medial thigh and calf when pressure is applied to the skin overlying the vein.  She denies any hardening in the vein.  She also notes increased leg swelling with continued standing.  The patient has had no history of DVT, known history of pregnancy, known history of varicose vein, no history of venous stasis ulcers, no history of  Lymphedema and no history of skin changes in lower legs.  There is known family history of venous disorders.  The patient has never used compression stockings in the past.   Past Medical History:  Diagnosis Date  . Allergic rhinitis   . Allergy   . Breast mass   . Chicken pox   . Eczema   . Headache    Hospitalize, right parotid questionable enlargement, MRI/CT and ultrasound completed was believed to be normal.  . Major depression   . Malaise   . Migraines   . Vitamin D deficiency     Past Surgical History:  Procedure Laterality Date  . WISDOM TOOTH EXTRACTION      Social History   Social History  . Marital status: Single    Spouse name: N/A  . Number of children: N/A  . Years of education: N/A   Occupational History  . Not on file.   Social History Main Topics  . Smoking status: Never Smoker  . Smokeless tobacco: Never Used  . Alcohol use 1.2 oz/week    2 Glasses of wine per week  . Drug use: No  . Sexual activity: Yes    Birth control/ protection: Surgical     Comment: partner had vasectomy   Other Topics Concern  . Not on file   Social History Narrative   Partner Meta Hatchet, 2 children [place] and [place]   BA. Marketing.    Caffeine use. Herbal use.  Daily vitamin.    Wears her seatbelt, bicycle helmet, smoke detector in the home.    Exercises routinely.    Feels safe in her relationships.         Family History  Problem Relation Age of Onset  . Cancer Mother     adenoid  cystic carcinoma   . Osteoporosis Mother   . Osteosclerosis Mother     hearing loss in early 27s  . Alcohol abuse Father   . Arthritis Father   . Cancer Father   . Diabetes Father   . Hearing loss Father   . Heart disease Father   . Alcohol abuse Maternal Aunt   . Alcohol abuse Maternal Uncle   . Alcohol abuse Paternal Uncle     Current Outpatient Prescriptions  Medication Sig Dispense Refill  . cetirizine (ZYRTEC) 10 MG tablet Take 10 mg by mouth daily as needed.     . triamcinolone cream (KENALOG) 0.1 % Apply 1 application topically 2 (two) times daily. 30 g 0  . loratadine (CLARITIN) 10 MG tablet Take 10 mg by mouth daily as needed.     . metroNIDAZOLE (FLAGYL) 500 MG tablet Take 1 tablet (500 mg total) by mouth 2 (two) times daily. (Patient not taking: Reported on 08/03/2016)  14 tablet 0  . valACYclovir (VALTREX) 1000 MG tablet Take 1 tablet (1,000 mg total) by mouth 2 (two) times daily. (Patient not taking: Reported on 12/14/2016) 20 tablet 0  . valACYclovir (VALTREX) 500 MG tablet Take 1 tablet (500 mg total) by mouth daily. (Patient not taking: Reported on 12/14/2016) 90 tablet 3   No current facility-administered medications for this visit.     No Known Allergies   REVIEW OF SYSTEMS:   Cardiac:  positive for: Shortness of breath upon exertion, negative for: Chest pain or chest pressure and Shortness of breath when lying flat,   Vascular:  positive for: no symptoms,  negative for: Pain in calf, thigh, or hip brought on by ambulation, Pain in feet at night that wakes you up from your sleep, Blood clot in your veins and Leg swelling  Pulmonary:  positive for: no symptoms,  negative for: Oxygen at home, Productive cough and  Wheezing  Neurologic:  positive for: No symptoms, negative for: Sudden weakness in arms or legs, Sudden numbness in arms or legs, Sudden onset of difficulty speaking or slurred speech, Temporary loss of vision in one eye and Problems with dizziness  Gastrointestinal:  positive for: no symptoms, negative for: Blood in stool and Vomited blood  Genitourinary:  positive for: no symptoms, negative for: Burning when urinating and Blood in urine  Psychiatric:  positive for: no symptoms,  negative for: Major depression  Hematologic:  positive for: no symptoms,  negative for: negative for: Bleeding problems and Problems with blood clotting too easily  Dermatologic:  positive for: no symptoms, negative for: Rashes or ulcers  Constitutional:  positive for: no symptoms, negative for: Fever or chills  Ear/Nose/Throat:  positive for: no symptoms, negative for: Change in hearing, Nose bleeds and Sore throat  Musculoskeletal:  positive for: no symptoms, negative for: Back pain, Joint pain and Muscle pain   Physical Examination  Vitals:   12/14/16 0847  BP: 108/65  Pulse: 71  Resp: 18  Temp: 97.8 F (36.6 C)  TempSrc: Oral  SpO2: 100%  Weight: 130 lb 4.8 oz (59.1 kg)  Height: 5\' 7"  (1.702 m)    Body mass index is 20.41 kg/m.  General: Alert, O x 3, WD,NAD  Head: [place]/AT,   Ear/Nose/Throat: Hearing grossly intact, nares without erythema or drainage, oropharynx without Erythema or Exudate , Mallampati score: 3, Dentition intact  Eyes: PERRLA, EOMI,   Neck: Supple, mid-line trachea,    Pulmonary: Sym exp, good B air movt,CTA B  Cardiac: RRR, Nl S1, S2, no Murmurs, No rubs, No S3,S4  Vascular: Vessel Right Left  Radial Palpable Palpable  Brachial Palpable Palpable  Carotid Palpable, No Bruit Palpable, No Bruit  Aorta Not palpable N/A  Femoral Palpable Palpable  Popliteal Not palpable Not palpable  PT Palpable Palpable  DP Palpable Palpable    Gastrointestinal: soft, non-distended, non-tender to palpation, No guarding or rebound, no HSM, no masses, no CVAT B, No palpable prominent aortic pulse,    Musculoskeletal: M/S 5/5 throughout  , Extremities without ischemic changes  , No edema present, mild calf asx: L>R, Varicosities present: L>R, No LDS present  Neurologic: CN 2-12 intact , Pain and light touch intact in extremities , Motor exam as listed above  Psychiatric: Judgement intact, Mood & affect appropriate for pt's clinical situation  Dermatologic: See M/S exam for extremity exam, No rashes otherwise noted  Lymph : Palpable lymph nodes: None   Non-Invasive Vascular Imaging  LLE Venous Insufficiency Duplex (Date: 12/14/2016):  LLE:  no DVT and SVT,   + GSV reflux: 3.0-3.7 mm, SFJ 8.0 mm  no SSV reflux,  + deep venous reflux: CFV   Outside Studies/Documentation 3 pages of outside documents were reviewed including: outpatient PCP.   Medical Decision Making  Darlene Benton is a 46 y.o. female who presents with: LLE chronic venous insufficiency (C2), varicose veins with complications   Pt sx are mild consistent with inflammation associated with SVT but she has no evidence of such today.  Based on the patient's history and examination, I recommend: compressive therapy.  I discussed with the patient the use of her 20-30 mm thigh high compression stockings and need for 3 month trial of such.  The patient will follow up in 3 months with my [facility] for re-evaluation.  I will repeat the LLE reflux exam at that time, as she doesn't quite meet criteria for EVLA as GSV < 4.5 mm.  Thank you for allowing [place] to participate in this patient's care.   Leonides Sake, MD [facility] Office: [phone] Pager: [phone]  12/14/2016, 9:28 AM

## 2016-12-20 NOTE — Addendum Note (Signed)
Addended by: Delight Bickle A on: 10/30/2017 04:18 PM   Modules accepted: Orders  

## 2017-03-04 ENCOUNTER — Encounter: Payer: Self-pay | Admitting: Vascular Surgery

## 2017-03-15 ENCOUNTER — Ambulatory Visit (HOSPITAL_COMMUNITY): Payer: Managed Care, Other (non HMO)

## 2017-03-15 ENCOUNTER — Ambulatory Visit: Payer: Managed Care, Other (non HMO) | Admitting: Vascular Surgery

## 2017-03-15 ENCOUNTER — Inpatient Hospital Stay (HOSPITAL_COMMUNITY): Admission: RE | Admit: 2017-03-15 | Payer: Managed Care, Other (non HMO) | Source: Ambulatory Visit

## 2017-03-19 ENCOUNTER — Ambulatory Visit: Payer: Managed Care, Other (non HMO) | Admitting: Vascular Surgery

## 2017-07-08 ENCOUNTER — Other Ambulatory Visit: Payer: Self-pay | Admitting: Family Medicine

## 2017-07-08 DIAGNOSIS — R921 Mammographic calcification found on diagnostic imaging of breast: Secondary | ICD-10-CM

## 2017-07-18 ENCOUNTER — Ambulatory Visit
Admission: RE | Admit: 2017-07-18 | Discharge: 2017-07-18 | Disposition: A | Discharge status: Home / Self Care | Payer: Managed Care, Other (non HMO) | Source: Ambulatory Visit | Attending: Family Medicine | Admitting: Family Medicine

## 2017-07-18 ENCOUNTER — Other Ambulatory Visit: Payer: Self-pay | Admitting: Family Medicine

## 2017-07-18 DIAGNOSIS — R921 Mammographic calcification found on diagnostic imaging of breast: Secondary | ICD-10-CM

## 2018-01-17 ENCOUNTER — Encounter (HOSPITAL_COMMUNITY): Payer: Self-pay

## 2018-01-17 ENCOUNTER — Other Ambulatory Visit: Payer: Self-pay

## 2018-01-17 ENCOUNTER — Emergency Department (HOSPITAL_COMMUNITY)
Admission: EM | Admit: 2018-01-17 | Discharge: 2018-01-18 | Disposition: A | Discharge status: Home / Self Care | Payer: Managed Care, Other (non HMO) | Attending: Emergency Medicine | Admitting: Emergency Medicine

## 2018-01-17 DIAGNOSIS — L509 Urticaria, unspecified: Secondary | ICD-10-CM | POA: Diagnosis present

## 2018-01-17 DIAGNOSIS — T7840XA Allergy, unspecified, initial encounter: Secondary | ICD-10-CM | POA: Diagnosis not present

## 2018-01-17 MED ORDER — FAMOTIDINE 20 MG PO TABS
20.0000 mg | ORAL_TABLET | Freq: Once | ORAL | Status: AC
Start: 2018-01-17 — End: 2018-01-17
  Administered 2018-01-17: 20 mg via ORAL
  Filled 2018-01-17: qty 1

## 2018-01-17 MED ORDER — PREDNISONE 20 MG PO TABS
60.0000 mg | ORAL_TABLET | Freq: Once | ORAL | Status: AC
Start: 2018-01-17 — End: 2018-01-17
  Administered 2018-01-17: 60 mg via ORAL
  Filled 2018-01-17: qty 3

## 2018-01-17 NOTE — ED Notes (Signed)
Pt stated that she was going to go home and come back if she got worse

## 2018-01-17 NOTE — ED Provider Notes (Signed)
Patient placed in Quick Look pathway, seen and evaluated   Chief Complaint: Allergic reaction  HPI:   Patient states that approximately 12:00 today she had allergy shots.  She states that at approximately 530 this afternoon she started having hives to her torso, face.  She reports some tingling to her lips.  She reports a scratchy throat but denies any throat closing sensation.  Denies any tongue swelling.  Patient denies any associated vomiting, lightheadedness or dizziness.  Does report some cough.  Patient called her allergist who told her to use her EpiPen and take a Claritin.  Patient states that she did not use her EpiPen but did take a Claritin.  Patient states that she does have a reaction to Benadryl which is what she would like to avoid.  ROS: Rash, paresthesias, cough (one)  Physical Exam:   Gen: No distress  Neuro: Awake and Alert  Skin: Warm    Focused Exam: Oropharynx is clear without any angioedema.  No angioedema of the tongue or lips.  Managing secretions tolerating airway.  Lungs clear to auscultation bilaterally.  Heart regular rate and rhythm.  She does have a diffuse erythematous papular rash to the torso, face and arms.  Pruritic in nature.  Peaking in complete sentences.  No tachypnea noted.  No increased work of breathing.   Initiation of care has begun. The patient has been counseled on the process, plan, and necessity for staying for the completion/evaluation, and the remainder of the medical screening examination   Discussed with the patient that exiting the department prior to completion of the work-up is AMA and there is no guarantee that there are no emergency medical conditions present.     Rise MuLeaphart, Avamarie Crossley T, PA-C 01/17/18 1850    Tegeler, Canary Brimhristopher J, MD 01/27/18 [phone]

## 2018-01-17 NOTE — ED Notes (Signed)
No answer when called for treatment room.  ?

## 2018-01-17 NOTE — ED Triage Notes (Signed)
Pt states she had allergy shot today at 1530 and began having allergic rxn around 1730. She was advised by her allergist to come to the ED. Pt states she did not use her epi pen and that she took a Claritin. Pt denies SOB

## 2018-01-17 NOTE — ED Notes (Signed)
No answer in waiting room 

## 2018-01-18 NOTE — ED Notes (Signed)
No answer in waiting room 

## 2018-02-04 ENCOUNTER — Ambulatory Visit
Admission: RE | Admit: 2018-02-04 | Discharge: 2018-02-04 | Disposition: A | Discharge status: Home / Self Care | Payer: Managed Care, Other (non HMO) | Source: Ambulatory Visit | Attending: Family Medicine | Admitting: Family Medicine

## 2018-02-04 ENCOUNTER — Other Ambulatory Visit: Payer: Self-pay | Admitting: Family Medicine

## 2018-02-04 DIAGNOSIS — R921 Mammographic calcification found on diagnostic imaging of breast: Secondary | ICD-10-CM

## 2019-05-27 ENCOUNTER — Telehealth: Payer: Self-pay | Admitting: Family Medicine

## 2019-05-27 NOTE — Telephone Encounter (Signed)
Tomorrow is rather booked.

## 2019-05-27 NOTE — Telephone Encounter (Signed)
Please advise if pt can be seen tomorrow as Virtual visit or recommendations?

## 2019-05-27 NOTE — Telephone Encounter (Signed)
Patient left VM that her daughter had impetigo and she has it too. Patient would like to make an appointment. I called to make an appointment, left VM.

## 2019-05-28 ENCOUNTER — Ambulatory Visit (INDEPENDENT_AMBULATORY_CARE_PROVIDER_SITE_OTHER): Payer: Managed Care, Other (non HMO) | Admitting: Physician Assistant

## 2019-05-28 ENCOUNTER — Encounter: Payer: Self-pay | Admitting: Physician Assistant

## 2019-05-28 VITALS — Ht 67.0 in | Wt 137.0 lb

## 2019-05-28 DIAGNOSIS — R21 Rash and other nonspecific skin eruption: Secondary | ICD-10-CM

## 2019-05-28 MED ORDER — CEPHALEXIN 500 MG PO CAPS: 500.0000 mg | ORAL_CAPSULE | Freq: Three times a day (TID) | ORAL | 0 refills | Status: AC

## 2019-05-28 MED ORDER — HYDROXYZINE HCL 25 MG PO TABS: 25.0000 mg | ORAL_TABLET | Freq: Three times a day (TID) | ORAL | 0 refills | Status: DC | PRN

## 2019-05-28 MED ORDER — MUPIROCIN 2 % EX OINT: TOPICAL_OINTMENT | CUTANEOUS | 0 refills | Status: DC

## 2019-05-28 NOTE — Telephone Encounter (Signed)
Pt was scheduled with another [facility] provider as VV today

## 2019-05-28 NOTE — Progress Notes (Signed)
TELEPHONE ENCOUNTER   Patient verbally agreed to telephone visit and is aware that copayment and coinsurance may apply. Patient was treated using telemedicine according to accepted telemedicine protocols.  Location of the patient: home Location of provider: [place] Names of all persons participating in the telemedicine service and role in the encounter: Darlene Benton, [place] , Darlene Benton  Subjective:   Chief Complaint  Patient presents with  . Impetigo    long history of skin issue daughters have impetigo dx 1 week ago. Hands very painfull open sores with discharge.      HPI   Patient reports long-standing history of skin issues, has eczema on hands but has been seeing dermatology and is looking into possible other diagnoses.  Her daughters were both dx with impetigo after being on a waterslide at a friends home. The other children using the slide also had the same issues. Both daughters have the areas on their legs and required oral abx, keflex.  Patient herself has the lesions on her hands. They are open with clear drainage and very itchy. She has tried OTC hydrocortisone cream and neosporin. Takes zyrtec and singular at baseline.  Denies: fever, purulent discharge from lesions, chills  Patient Active Problem List   Diagnosis Date Noted  . Varicose veins of both lower extremities with complications 63/87/5643  . Chronic venous insufficiency 12/14/2016  . Abnormal mammogram 09/28/2016  . 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 Encounter for general adult medical examination with abnormal findings 08/03/2016  . Eczema of both hands 08/03/2016  . Varicose vein of leg 08/03/2016  . FH: deafness or hearing loss 08/03/2016  . BV (bacterial vaginosis) 08/03/2016   Social History   Tobacco Use  . Smoking status: Never Smoker  . Smokeless tobacco: Never Used  Substance Use Topics  . Alcohol use: Yes    Alcohol/week: 2.0 standard drinks    Types: 2 Glasses of  wine per week    Current Outpatient Medications:  .  cetirizine (ZYRTEC) 10 MG tablet, Take 10 mg by mouth daily as needed. , Disp: , Rfl:  .  EPINEPHrine 0.3 mg/0.3 mL IJ SOAJ injection, U UTD, Disp: , Rfl:  .  EUCRISA 2 % OINT, APP AA BID, Disp: , Rfl:  .  loratadine (CLARITIN) 10 MG tablet, Take 10 mg by mouth daily as needed. , Disp: , Rfl:  .  montelukast (SINGULAIR) 10 MG tablet, TK 1 T PO QD, Disp: , Rfl:  .  triamcinolone cream (KENALOG) 0.1 %, Apply 1 application topically 2 (two) times daily., Disp: 30 g, Rfl: 0 .  cephALEXin (KEFLEX) 500 MG capsule, Take 1 capsule (500 mg total) by mouth 3 (three) times daily for 7 days., Disp: 21 capsule, Rfl: 0 .  hydrOXYzine (ATARAX/VISTARIL) 25 MG tablet, Take 1 tablet (25 mg total) by mouth 3 (three) times daily as needed for itching., Disp: 30 tablet, Rfl: 0 .  mupirocin ointment (BACTROBAN) 2 %, Apply to affected area 1-2 x daily, Disp: 22 g, Rfl: 0 Allergies  Allergen Reactions  . Benadryl [Diphenhydramine] Anxiety    Paradoxical reaction    Assessment & Plan:   1. Rash and nonspecific skin eruption    Unfortunately, we were unable to get the video to work during our call. I recommended that she take pictures of her hands and send via MyChart, or at least keep on her phone so she can monitor progression and show provider if needed.  Will treat with  keflex and mupirocin. She is not sleeping due to the itching and we discussed atarax -- she has a paradoxical reaction to benadryl, so I advised her to discuss with the pharmacist about this and if she will likely have this problem with this medication.  Patient verbalized understanding. Follow-up with us or PCP if worsening. Consider referral back to derm if any further concerns.   No orders of the defined types were placed in this encounter.  Meds ordered this encounter  Medications  . cephALEXin (KEFLEX) 500 MG capsule    Sig: Take 1 capsule (500 mg total) by mouth 3 (three) times  daily for 7 days.    Dispense:  21 capsule    Refill:  0    Order Specific Question:   Supervising Provider    Answer:   Darlene Benton, Darlene [[id number]]  . mupirocin ointment (BACTROBAN) 2 %    Sig: Apply to affected area 1-2 x daily    Dispense:  22 g    Refill:  0    Order Specific Question:   Supervising Provider    Answer:   Darlene Benton, Darlene [[id number]]  . hydrOXYzine (ATARAX/VISTARIL) 25 MG tablet    Sig: Take 1 tablet (25 mg total) by mouth 3 (three) times daily as needed for itching.    Dispense:  30 tablet    Refill:  0    Order Specific Question:   Supervising Provider    Answer:   Darlene Benton, Darlene [[id number]]    Darlene MottoSamantha Nymir Ringler, PA 05/28/2019  Time spent with the patient: 12 minutes, spent in obtaining information about her symptoms, reviewing her previous labs, evaluations, and treatments, counseling her about her condition (please see the discussed topics above), and developing a plan to further investigate it; she had a number of questions which I addressed.

## 2020-08-19 ENCOUNTER — Encounter: Payer: Self-pay | Admitting: Family Medicine

## 2020-08-19 ENCOUNTER — Other Ambulatory Visit: Payer: Self-pay | Admitting: Family Medicine

## 2020-08-19 ENCOUNTER — Ambulatory Visit (INDEPENDENT_AMBULATORY_CARE_PROVIDER_SITE_OTHER): Payer: No Typology Code available for payment source | Admitting: Family Medicine

## 2020-08-19 ENCOUNTER — Other Ambulatory Visit: Payer: Self-pay

## 2020-08-19 VITALS — BP 106/62 | HR 73 | Temp 98.6°F | Ht 67.0 in | Wt 140.0 lb

## 2020-08-19 DIAGNOSIS — F418 Other specified anxiety disorders: Secondary | ICD-10-CM | POA: Insufficient documentation

## 2020-08-19 DIAGNOSIS — Z1211 Encounter for screening for malignant neoplasm of colon: Secondary | ICD-10-CM | POA: Diagnosis not present

## 2020-08-19 DIAGNOSIS — Z01419 Encounter for gynecological examination (general) (routine) without abnormal findings: Secondary | ICD-10-CM

## 2020-08-19 DIAGNOSIS — L309 Dermatitis, unspecified: Secondary | ICD-10-CM

## 2020-08-19 DIAGNOSIS — Z0000 Encounter for general adult medical examination without abnormal findings: Secondary | ICD-10-CM | POA: Diagnosis not present

## 2020-08-19 DIAGNOSIS — R1319 Other dysphagia: Secondary | ICD-10-CM

## 2020-08-19 DIAGNOSIS — Z131 Encounter for screening for diabetes mellitus: Secondary | ICD-10-CM

## 2020-08-19 DIAGNOSIS — Z79899 Other long term (current) drug therapy: Secondary | ICD-10-CM

## 2020-08-19 DIAGNOSIS — R1310 Dysphagia, unspecified: Secondary | ICD-10-CM | POA: Diagnosis not present

## 2020-08-19 DIAGNOSIS — Z13220 Encounter for screening for lipoid disorders: Secondary | ICD-10-CM

## 2020-08-19 DIAGNOSIS — Z130 Encounter for screening for diseases of the blood and blood-forming organs and certain disorders involving the immune mechanism: Secondary | ICD-10-CM

## 2020-08-19 MED ORDER — VENLAFAXINE HCL ER 37.5 MG PO CP24: 37.5000 mg | ORAL_CAPSULE | Freq: Every day | ORAL | 0 refills | Status: DC

## 2020-08-19 MED ORDER — TRIAMCINOLONE ACETONIDE 0.1 % EX CREA: 1.0000 "application " | TOPICAL_CREAM | Freq: Two times a day (BID) | CUTANEOUS | 5 refills | Status: DC

## 2020-08-19 MED ORDER — VENLAFAXINE HCL ER 75 MG PO CP24: 75.0000 mg | ORAL_CAPSULE | Freq: Every day | ORAL | 0 refills | Status: DC

## 2020-08-19 NOTE — Progress Notes (Signed)
This visit occurred during the SARS-CoV-2 public health emergency.  Safety protocols were in place, including screening questions prior to the visit, additional usage of staff PPE, and extensive cleaning of exam room while observing appropriate contact time as indicated for disinfecting solutions.    Patient ID: Darlene Benton, female  DOB: Oct 03, 1971, 49 y.o.   MRN: [id number] Patient Care Team    Relationship Specialty Notifications Start End  Natalia Leatherwood, DO PCP - General Family Medicine  07/25/16     Chief Complaint  Patient presents with  . Annual Exam    pap and mammo due; no OBGYN     Subjective:  Darlene Benton is a 49 y.o.  Female  present for CPE. All past medical history, surgical history, allergies, family history, immunizations, medications and social history were updated in the electronic medical record today. All recent labs, ED visits and hospitalizations within the last year were reviewed.  Health maintenance:  Colonoscopy:no fhx colon cancer, screen at 45> due.  Mammogram: 2019- [facility].> ordered today Cervical cancer screening: last pap 09/2014, abnormal once early in life, all normal since. Gyn referral placed today Immunizations: tdap 2014,Influenza declined today(encouraged yearly). Infectious disease screening: HIV and hep c screen negaive DEXA: n/a, mother with osteoporosis  Assistive device: none Oxygen use: none Patient has a Dental home. Hospitalizations/ED visits: reviewed  Eczema: Patient reports her eczema is not well controlled.  She has had eczema since she was a young child.  She feels her current regimen of steroid cream, Pam Drown is not working well for her and she feels her skin is getting thin.  She would like to discuss other options on eczema treatment.  Dysphagia: Patient reports she is noticed difficulty swallowing.  She has noticed certain types of foods will get stuck in her throat.  She discussed this with her allergist who encouraged  her to have a gastroenterology for referral for EGD.  Depression with anxiety: Patient reports she is not sleeping well.  She feels more stressed out than she has in the past.  She is not motivated, decreased concentration unable to focus.  She is having difficulty sleeping.  She is never referred to a therapist or have been placed on medications to help treat depression anxiety in the past.  She states that it took some time for her to come to terms that it could be depression causing her symptoms.  Depression screen [facility] 2/9 08/19/2020 07/25/2016  Decreased Interest 2 0  Down, Depressed, Hopeless 3 0  PHQ - 2 Score 5 0  Altered sleeping 3 -  Tired, decreased energy 3 -  Change in appetite 3 -  Feeling bad or failure about yourself  3 -  Trouble concentrating 3 -  Moving slowly or fidgety/restless 1 -  Suicidal thoughts 1 -  PHQ-9 Score 22 -   GAD 7 : Generalized Anxiety Score 08/19/2020  Nervous, Anxious, on Edge 3  Control/stop worrying 2  Worry too much - different things 3  Trouble relaxing 2  Restless 1  Easily annoyed or irritable 3  Afraid - awful might happen 2  Total GAD 7 Score 16    Immunization History  Administered Date(s) Administered  . PFIZER SARS-COV-2 Vaccination 02/24/2020, 03/15/2020  . Tdap 11/26/2012    Past Medical History:  Diagnosis Date  . Allergic rhinitis   . Allergy   . Breast mass   . Chicken pox   . Eczema   . Headache    Hospitalize, right parotid  questionable enlargement, MRI/CT and ultrasound completed was believed to be normal.  . Major depression   . Malaise   . Migraines   . Vitamin D deficiency    Allergies  Allergen Reactions  . Benadryl [Diphenhydramine] Anxiety    Paradoxical reaction   Past Surgical History:  Procedure Laterality Date  . WISDOM TOOTH EXTRACTION     Family History  Problem Relation Age of Onset  . Cancer Mother        adenoid  cystic carcinoma   . Osteoporosis Mother   . Osteosclerosis Mother         hearing loss in early 39s  . Alcohol abuse Father   . Arthritis Father   . Cancer Father   . Diabetes Father   . Hearing loss Father   . Heart disease Father   . Alcohol abuse Maternal Aunt   . Alcohol abuse Maternal Uncle   . Alcohol abuse Paternal Uncle    Social History   Social History Narrative   Partner [place], 2 children [place] and [place]   BA. Marketing.    Caffeine use. Herbal use. Daily vitamin.    Wears her seatbelt, bicycle helmet, smoke detector in the home.    Exercises routinely.    Feels safe in her relationships.     Allergies as of 08/19/2020      Reactions   Benadryl [diphenhydramine] Anxiety   Paradoxical reaction      Medication List       Accurate as of August 19, 2020  5:18 PM. If you have any questions, ask your nurse or doctor.        STOP taking these medications   Eucrisa 2 % Oint Generic drug: Crisaborole Stopped by: Felix Pacini, DO   hydrOXYzine 25 MG tablet Commonly known as: ATARAX/VISTARIL Stopped by: Felix Pacini, DO     TAKE these medications   cetirizine 10 MG tablet Commonly known as: ZYRTEC Take 10 mg by mouth daily as needed.   EPINEPHrine 0.3 mg/0.3 mL Soaj injection Commonly known as: EPI-PEN U UTD   loratadine 10 MG tablet Commonly known as: CLARITIN Take 10 mg by mouth daily as needed.   montelukast 10 MG tablet Commonly known as: SINGULAIR TK 1 T PO QD   mupirocin ointment 2 % Commonly known as: BACTROBAN Apply to affected area 1-2 x daily   triamcinolone cream 0.1 % Commonly known as: KENALOG Apply 1 application topically 2 (two) times daily.   venlafaxine XR 37.5 MG 24 hr capsule Commonly known as: Effexor XR Take 1 capsule (37.5 mg total) by mouth daily with breakfast. Started by: Felix Pacini, DO   venlafaxine XR 75 MG 24 hr capsule Commonly known as: Effexor XR Take 1 capsule (75 mg total) by mouth daily with breakfast. Started by: Felix Pacini, DO       All past medical history,  surgical history, allergies, family history, immunizations andmedications were updated in the EMR today and reviewed under the history and medication portions of their EMR.     No results found for this or any previous visit (from the past 2160 hour(s)).   ROS: 14 pt review of systems performed and negative (unless mentioned in an HPI)  Objective: BP 106/62   Pulse 73   Temp 98.6 F (37 C) (Oral)   Ht 5\' 7"  (1.702 m)   Wt 140 lb (63.5 kg)   SpO2 100%   BMI 21.93 kg/m  Gen: Afebrile. No acute distress. Nontoxic in appearance,  well-developed, well-nourished, pleasant female. HENT: AT. [place]. Bilateral TM visualized and normal in appearance, normal external auditory canal. MMM, no oral lesions, adequate dentition. Bilateral nares within normal limits. Throat without erythema, ulcerations or exudates.  No cough on exam, no hoarseness on exam. Eyes:Pupils Equal Round Reactive to light, Extraocular movements intact,  Conjunctiva without redness, discharge or icterus. Neck/lymp/endocrine: Supple, no lymphadenopathy, no thyromegaly CV: RRR no murmur, no edema, +2/4 P posterior tibialis pulses.  Chest: CTAB, no wheeze, rhonchi or crackles.  Normal respiratory effort.  Good air movement. Abd: Soft.  Flat. NTND. BS present.  No masses palpated. No hepatosplenomegaly. No rebound tenderness or guarding. Skin: No rashes, purpura or petechiae. Warm and well-perfused. Skin intact. Neuro/Msk:  Normal gait. PERLA. EOMi. Alert. Oriented x3.  Cranial nerves II through XII intact. Muscle strength 5/5 upper/lower extremity. DTRs equal bilaterally. Psych: Normal affect, dress and demeanor. Normal speech. Normal thought content and judgment.   No exam data present  Assessment/plan: Fulton ReekKristen Mcwatters is a 49 y.o. female present for  CPE Colon cancer screening - Cologuard Diabetes mellitus screening - Comprehensive metabolic panel - Hemoglobin A1c Screening cholesterol level - Lipid panel-not  fasting Screening for deficiency anemia - CBC with Differential/Platelet Depression with anxiety New Problem Lengthy discussion with patient today surrounding possible treatment plans.  Different options were discussed with her.  Joint decision to start Effexor taper to 75 mg daily. - TSH Follow-up in 6 weeks Dysphagia: New problem  pt asked for gastroenterology referral for difficulty swallowing. She states her allergists suggested she may need an EGD.  Eczema of both hands Chronic problem, not well controlled New dermatology referral placed to her today. - triamcinolone cream (KENALOG) 0.1 %; Apply 1 application topically 2 (two) times daily.  Dispense: 80 g; Refill: 5 Visit for pelvic exam - Ambulatory referral to Gynecology Encounter for preventative adult health care examination Patient was encouraged to exercise greater than 150 minutes a week. Patient was encouraged to choose a diet filled with fresh fruits and vegetables, and lean meats. AVS provided to patient today for education/recommendation on gender specific health and safety maintenance. Colonoscopy:no fhx colon cancer, screen at 45> due.  Cologuard ordered Mammogram: 2019- [facility].> ordered today Cervical cancer screening: last pap 09/2014, abnormal once early in life, all normal since. Gyn referral placed today Immunizations: tdap 2014,Influenza declined today(encouraged yearly).  Covid series completed. Infectious disease screening: HIV and hep c screen negaive DEXA: n/a, mother with osteoporosis   Return in about 6 weeks (around 09/30/2020) for [facility] (30 min).  Orders Placed This Encounter  Procedures  . CBC with Differential/Platelet  . Comprehensive metabolic panel  . Lipid panel  . TSH  . Hemoglobin A1c  . Cologuard  . Ambulatory referral to Dermatology  . Ambulatory referral to Gynecology  . Ambulatory referral to Gastroenterology   Meds ordered this encounter  Medications  . venlafaxine XR (EFFEXOR XR)  37.5 MG 24 hr capsule    Sig: Take 1 capsule (37.5 mg total) by mouth daily with breakfast.    Dispense:  7 capsule    Refill:  0  . venlafaxine XR (EFFEXOR XR) 75 MG 24 hr capsule    Sig: Take 1 capsule (75 mg total) by mouth daily with breakfast.    Dispense:  90 capsule    Refill:  0    Script #2 to start after 1 week of 37.5 mg also called in today  . triamcinolone cream (KENALOG) 0.1 %    Sig: Apply 1  application topically 2 (two) times daily.    Dispense:  80 g    Refill:  5    Referral Orders     Ambulatory referral to Dermatology     Ambulatory referral to Gynecology     Ambulatory referral to Gastroenterology  Complete preventative exam completed today, as well has> 20 Minutes was dedicated to this patient's encounter to include pre-visit review of chart, face-to-face time with patient and post-visit work- which include documentation and prescribing medications and/or ordering test when necessary for greater than one new concern.  Electronically signed by: Felix Pacini, DO [facility]

## 2020-08-19 NOTE — Patient Instructions (Addendum)
Health Maintenance, Female Adopting a healthy lifestyle and getting preventive care are important in promoting health and wellness. Ask your health care provider about:  The right schedule for you to have regular tests and exams.  Things you can do on your own to prevent diseases and keep yourself healthy. What should I know about diet, weight, and exercise? Eat a healthy diet   Eat a diet that includes plenty of vegetables, fruits, low-fat dairy products, and lean protein.  Do not eat a lot of foods that are high in solid fats, added sugars, or sodium. Maintain a healthy weight Body mass index (BMI) is used to identify weight problems. It estimates body fat based on height and weight. Your health care provider can help determine your BMI and help you achieve or maintain a healthy weight. Get regular exercise Get regular exercise. This is one of the most important things you can do for your health. Most adults should:  Exercise for at least 150 minutes each week. The exercise should increase your heart rate and make you sweat (moderate-intensity exercise).  Do strengthening exercises at least twice a week. This is in addition to the moderate-intensity exercise.  Spend less time sitting. Even light physical activity can be beneficial. Watch cholesterol and blood lipids Have your blood tested for lipids and cholesterol at 47 years of age, then have this test every 5 years. Have your cholesterol levels checked more often if:  Your lipid or cholesterol levels are high.  You are older than 47 years of age.  You are at high risk for heart disease. What should I know about cancer screening? Depending on your health history and family history, you may need to have cancer screening at various ages. This may include screening for:  Breast cancer.  Cervical cancer.  Colorectal cancer.  Skin cancer.  Lung cancer. What should I know about heart disease, diabetes, and high blood  pressure? Blood pressure and heart disease  High blood pressure causes heart disease and increases the risk of stroke. This is more likely to develop in people who have high blood pressure readings, are of African descent, or are overweight.  Have your blood pressure checked: ? Every 3-5 years if you are 18-39 years of age. ? Every year if you are 40 years old or older. Diabetes Have regular diabetes screenings. This checks your fasting blood sugar level. Have the screening done:  Once every three years after age 40 if you are at a normal weight and have a low risk for diabetes.  More often and at a younger age if you are overweight or have a high risk for diabetes. What should I know about preventing infection? Hepatitis B If you have a higher risk for hepatitis B, you should be screened for this virus. Talk with your health care provider to find out if you are at risk for hepatitis B infection. Hepatitis C Testing is recommended for:  Everyone born from 1945 through 1965.  Anyone with known risk factors for hepatitis C. Sexually transmitted infections (STIs)  Get screened for STIs, including gonorrhea and chlamydia, if: ? You are sexually active and are younger than 47 years of age. ? You are older than 47 years of age and your health care provider tells you that you are at risk for this type of infection. ? Your sexual activity has changed since you were last screened, and you are at increased risk for chlamydia or gonorrhea. Ask your health care provider if   you are at risk.  Ask your health care provider about whether you are at high risk for HIV. Your health care provider may recommend a prescription medicine to help prevent HIV infection. If you choose to take medicine to prevent HIV, you should first get tested for HIV. You should then be tested every 3 months for as long as you are taking the medicine. Pregnancy  If you are about to stop having your period (premenopausal) and  you may become pregnant, seek counseling before you get pregnant.  Take 400 to 800 micrograms (mcg) of folic acid every day if you become pregnant.  Ask for birth control (contraception) if you want to prevent pregnancy. Osteoporosis and menopause Osteoporosis is a disease in which the bones lose minerals and strength with aging. This can result in bone fractures. If you are 65 years old or older, or if you are at risk for osteoporosis and fractures, ask your health care provider if you should:  Be screened for bone loss.  Take a calcium or vitamin D supplement to lower your risk of fractures.  Be given hormone replacement therapy (HRT) to treat symptoms of menopause. Follow these instructions at home: Lifestyle  Do not use any products that contain nicotine or tobacco, such as cigarettes, e-cigarettes, and chewing tobacco. If you need help quitting, ask your health care provider.  Do not use street drugs.  Do not share needles.  Ask your health care provider for help if you need support or information about quitting drugs. Alcohol use  Do not drink alcohol if: ? Your health care provider tells you not to drink. ? You are pregnant, may be pregnant, or are planning to become pregnant.  If you drink alcohol: ? Limit how much you use to 0-1 drink a day. ? Limit intake if you are breastfeeding.  Be aware of how much alcohol is in your drink. In the [place] one drink equals one 12 oz bottle of beer (355 mL), one 5 oz glass of wine (148 mL), or one 1 oz glass of hard liquor (44 mL). General instructions  Schedule regular health, dental, and eye exams.  Stay current with your vaccines.  Tell your health care provider if: ? You often feel depressed. ? You have ever been abused or do not feel safe at home. Summary  Adopting a healthy lifestyle and getting preventive care are important in promoting health and wellness.  Follow your health care provider's instructions about healthy  diet, exercising, and getting tested or screened for diseases.  Follow your health care provider's instructions on monitoring your cholesterol and blood pressure. This information is not intended to replace advice given to you by your health care provider. Make sure you discuss any questions you have with your health care provider. Document Revised: 11/05/2018 Document Reviewed: 11/05/2018 Elsevier Patient Education  2020 Elsevier Inc.  

## 2020-08-23 ENCOUNTER — Telehealth: Payer: Self-pay

## 2020-08-23 NOTE — Telephone Encounter (Signed)
Attempted to reach pt to let her know that information is needed to get her cologuard done. Please give pt number [phone]. Needing to verify insurance.

## 2020-08-23 NOTE — Telephone Encounter (Signed)
Started in error

## 2020-08-24 ENCOUNTER — Telehealth: Payer: Self-pay | Admitting: Family Medicine

## 2020-08-24 LAB — COMPREHENSIVE METABOLIC PANEL
AG Ratio: 2.2 (calc) (ref 1.0–2.5)
ALT: 16 U/L (ref 6–29)
AST: 17 U/L (ref 10–35)
Albumin: 4.6 g/dL (ref 3.6–5.1)
Alkaline phosphatase (APISO): 46 U/L (ref 31–125)
BUN: 8 mg/dL (ref 7–25)
CO2: 28 mmol/L (ref 20–32)
Calcium: 9.3 mg/dL (ref 8.6–10.2)
Chloride: 103 mmol/L (ref 98–110)
Creat: 0.64 mg/dL (ref 0.50–1.10)
Globulin: 2.1 g/dL (calc) (ref 1.9–3.7)
Glucose, Bld: 96 mg/dL (ref 65–99)
Potassium: 3.8 mmol/L (ref 3.5–5.3)
Sodium: 138 mmol/L (ref 135–146)
Total Bilirubin: 0.4 mg/dL (ref 0.2–1.2)
Total Protein: 6.7 g/dL (ref 6.1–8.1)

## 2020-08-24 LAB — CBC WITH DIFFERENTIAL/PLATELET
Absolute Monocytes: 410 cells/uL (ref 200–950)
Basophils Absolute: 60 cells/uL (ref 0–200)
Basophils Relative: 1.2 %
Eosinophils Absolute: 655 cells/uL — ABNORMAL HIGH (ref 15–500)
Eosinophils Relative: 13.1 %
HCT: 37.8 % (ref 35.0–45.0)
Hemoglobin: 12.6 g/dL (ref 11.7–15.5)
Lymphs Abs: 2010 cells/uL (ref 850–3900)
MCH: 32.5 pg (ref 27.0–33.0)
MCHC: 33.3 g/dL (ref 32.0–36.0)
MCV: 97.4 fL (ref 80.0–100.0)
MPV: 11.9 fL (ref 7.5–12.5)
Monocytes Relative: 8.2 %
Neutro Abs: 1865 cells/uL (ref 1500–7800)
Neutrophils Relative %: 37.3 %
Platelets: 305 10*3/uL (ref 140–400)
RBC: 3.88 10*6/uL (ref 3.80–5.10)
RDW: 11.4 % (ref 11.0–15.0)
Total Lymphocyte: 40.2 %
WBC: 5 10*3/uL (ref 3.8–10.8)

## 2020-08-24 LAB — HEMOGLOBIN A1C
Hgb A1c MFr Bld: 5.1 % of total Hgb (ref <5.7)
Mean Plasma Glucose: 100 (calc)
eAG (mmol/L): 5.5 (calc)

## 2020-08-24 LAB — LIPID PANEL
Cholesterol: 167 mg/dL (ref <200)
HDL: 80 mg/dL (ref >=50)
LDL Cholesterol (Calc): 75 mg/dL (calc)
Non-HDL Cholesterol (Calc): 87 mg/dL (calc) (ref <130)
Total CHOL/HDL Ratio: 2.1 (calc) (ref <5.0)
Triglycerides: 41 mg/dL (ref <150)

## 2020-08-24 LAB — TSH: TSH: 1.85 mIU/L

## 2020-08-24 NOTE — Telephone Encounter (Signed)
Please call patient Darlene Benton, kidney and thyroid function are normal Blood cell counts and electrolytes are normal Diabetes screening/A1c is normal at 5.1 Cholesterol panel looks great with a total cholesterol 167, HDL of 80, triglycerides 41 and LDL 75.  Please inform patient that her labs will eventually appear in her E chart.  Unfortunately, the reporting laboratory is having a Ship broker and are working on correcting.  We do have a hard copy if she would like a copy, please mail to her.  Also scan a copy into the system for [place].

## 2020-08-25 NOTE — Telephone Encounter (Signed)
Spoke with pt regarding labs and instructions. Results mailed.

## 2020-08-25 NOTE — Telephone Encounter (Signed)
Patient is returning a call about lab results.  

## 2020-08-25 NOTE — Telephone Encounter (Signed)
LVM for pt to CB regarding results and instructions.

## 2020-09-30 ENCOUNTER — Ambulatory Visit (INDEPENDENT_AMBULATORY_CARE_PROVIDER_SITE_OTHER): Payer: No Typology Code available for payment source | Admitting: Family Medicine

## 2020-09-30 ENCOUNTER — Other Ambulatory Visit: Payer: Self-pay

## 2020-09-30 ENCOUNTER — Encounter: Payer: Self-pay | Admitting: Family Medicine

## 2020-09-30 VITALS — BP 111/71 | HR 99 | Temp 98.5°F | Ht 67.0 in | Wt 140.0 lb

## 2020-09-30 DIAGNOSIS — G479 Sleep disorder, unspecified: Secondary | ICD-10-CM | POA: Diagnosis not present

## 2020-09-30 DIAGNOSIS — F418 Other specified anxiety disorders: Secondary | ICD-10-CM

## 2020-09-30 HISTORY — DX: Sleep disorder, unspecified: G47.9

## 2020-09-30 MED ORDER — VENLAFAXINE HCL ER 75 MG PO CP24: 75.0000 mg | ORAL_CAPSULE | Freq: Every day | ORAL | 1 refills | Status: DC

## 2020-09-30 NOTE — Progress Notes (Signed)
This visit occurred during the SARS-CoV-2 public health emergency.  Safety protocols were in place, including screening questions prior to the visit, additional usage of staff PPE, and extensive cleaning of exam room while observing appropriate contact time as indicated for disinfecting solutions.    Patient ID: Darlene Benton, female  DOB: 02-15-1971, 49 y.o.   MRN: [id number] Patient Care Team    Relationship Specialty Notifications Start End  Natalia Leatherwood, DO PCP - General Family Medicine  07/25/16     Chief Complaint  Patient presents with  . Follow-up    [facility]    Subjective: Darlene Benton is a 49 y.o.  Female  present for Depression with anxiety/sleep disturbance: Patient reports she is feeling much improved on Effexor. She has started to feel like herself again. She is sleeping better- still not as good, but better.  Prior note: Patient reports she is not sleeping well.  She feels more stressed out than she has in the past.  She is not motivated, decreased concentration unable to focus.  She is having difficulty sleeping.  She is never referred to a therapist or have been placed on medications to help treat depression anxiety in the past.  She states that it took some time for her to come to terms that it could be depression causing her symptoms.  Depression screen [facility] 2/9 09/30/2020 08/19/2020 07/25/2016  Decreased Interest 1 2 0  Down, Depressed, Hopeless 0 3 0  PHQ - 2 Score 1 5 0  Altered sleeping 1 3 -  Tired, decreased energy 2 3 -  Change in appetite 1 3 -  Feeling bad or failure about yourself  0 3 -  Trouble concentrating 1 3 -  Moving slowly or fidgety/restless 0 1 -  Suicidal thoughts 0 1 -  PHQ-9 Score 6 22 -   GAD 7 : Generalized Anxiety Score 09/30/2020 08/19/2020  Nervous, Anxious, on Edge 1 3  Control/stop worrying 0 2  Worry too much - different things 0 3  Trouble relaxing 0 2  Restless 0 1  Easily annoyed or irritable 1 3  Afraid - awful might happen 0 2   Total GAD 7 Score 2 16    Immunization History  Administered Date(s) Administered  . PFIZER SARS-COV-2 Vaccination 02/24/2020, 03/15/2020  . Tdap 11/26/2012    Past Medical History:  Diagnosis Date  . Allergic rhinitis   . Allergy   . Breast mass   . Chicken pox   . Eczema   . Headache    Hospitalize, right parotid questionable enlargement, MRI/CT and ultrasound completed was believed to be normal.  . Major depression   . Malaise   . Migraines   . Vitamin D deficiency    Allergies  Allergen Reactions  . Benadryl [Diphenhydramine] Anxiety    Paradoxical reaction   Past Surgical History:  Procedure Laterality Date  . WISDOM TOOTH EXTRACTION     Family History  Problem Relation Age of Onset  . Cancer Mother        adenoid  cystic carcinoma   . Osteoporosis Mother   . Osteosclerosis Mother        hearing loss in early 28s  . Alcohol abuse Father   . Arthritis Father   . Cancer Father   . Diabetes Father   . Hearing loss Father   . Heart disease Father   . Alcohol abuse Maternal Aunt   . Alcohol abuse Maternal Uncle   . Alcohol abuse Paternal  Uncle    Social History   Social History Narrative   Partner [place], 2 children [place] and [place]   BA. Marketing.    Caffeine use. Herbal use. Daily vitamin.    Wears her seatbelt, bicycle helmet, smoke detector in the home.    Exercises routinely.    Feels safe in her relationships.     Allergies as of 09/30/2020      Reactions   Benadryl [diphenhydramine] Anxiety   Paradoxical reaction      Medication List       Accurate as of September 30, 2020  8:49 AM. If you have any questions, ask your nurse or doctor.        cetirizine 10 MG tablet Commonly known as: ZYRTEC Take 10 mg by mouth daily as needed.   EPINEPHrine 0.3 mg/0.3 mL Soaj injection Commonly known as: EPI-PEN U UTD   loratadine 10 MG tablet Commonly known as: CLARITIN Take 10 mg by mouth daily as needed.   montelukast 10 MG  tablet Commonly known as: SINGULAIR TK 1 T PO QD   mupirocin ointment 2 % Commonly known as: BACTROBAN Apply to affected area 1-2 x daily   triamcinolone cream 0.1 % Commonly known as: KENALOG Apply 1 application topically 2 (two) times daily.   venlafaxine XR 75 MG 24 hr capsule Commonly known as: Effexor XR Take 1 capsule (75 mg total) by mouth daily with breakfast. What changed: Another medication with the same name was removed. Continue taking this medication, and follow the directions you see here. Changed by: Felix Pacini, DO       All past medical history, surgical history, allergies, family history, immunizations andmedications were updated in the EMR today and reviewed under the history and medication portions of their EMR.       ROS: 14 pt review of systems performed and negative (unless mentioned in an HPI)  Objective: BP 111/71   Pulse 99   Temp 98.5 F (36.9 C) (Oral)   Ht 5\' 7"  (1.702 m)   Wt 140 lb (63.5 kg)   SpO2 100%   BMI 21.93 kg/m  Gen: Afebrile. No acute distress.  HENT: AT. [place].  Eyes:Pupils Equal Round Reactive to light, Extraocular movements intact,  Conjunctiva without redness, discharge or icterus. CV: RRR  Neuro: Normal gait. PERLA. EOMi. Alert. Oriented x3 Psych: Normal affect, dress and demeanor. Normal speech. Normal thought content and judgment.   No exam data present  Assessment/plan: Darlene Benton is a 49 y.o. female present for  Depression with anxiety/sleep disturbance Much improved.  Continue Effexor taper to 75 mg daily. Follow-up in 5.5 mos. Sooner if needed.    Return in about 6 months (around 03/20/2021) for [facility] (30 min).  No orders of the defined types were placed in this encounter.  Meds ordered this encounter  Medications  . venlafaxine XR (EFFEXOR XR) 75 MG 24 hr capsule    Sig: Take 1 capsule (75 mg total) by mouth daily with breakfast.    Dispense:  90 capsule    Refill:  1   Referral Orders  No referral(s)  requested today    Complete preventative exam completed today, as well has> 20 Minutes was dedicated to this patient's encounter to include pre-visit review of chart, face-to-face time with patient and post-visit work- which include documentation and prescribing medications and/or ordering test when necessary for greater than one new concern.  Electronically signed by: 03/22/2021, DO [facility]

## 2020-09-30 NOTE — Patient Instructions (Signed)
Great to see you today. I am glad you are feeling better.  Next appt 5.5 months.

## 2020-11-30 ENCOUNTER — Other Ambulatory Visit: Payer: Self-pay

## 2020-11-30 MED ORDER — VENLAFAXINE HCL ER 75 MG PO CP24: 75.0000 mg | ORAL_CAPSULE | Freq: Every day | ORAL | 0 refills | Status: DC

## 2021-03-28 ENCOUNTER — Other Ambulatory Visit: Payer: Self-pay

## 2021-03-29 ENCOUNTER — Ambulatory Visit (INDEPENDENT_AMBULATORY_CARE_PROVIDER_SITE_OTHER): Payer: No Typology Code available for payment source | Admitting: Family Medicine

## 2021-03-29 ENCOUNTER — Encounter: Payer: Self-pay | Admitting: Family Medicine

## 2021-03-29 VITALS — BP 107/67 | HR 116 | Ht 67.0 in | Wt 142.0 lb

## 2021-03-29 DIAGNOSIS — R413 Other amnesia: Secondary | ICD-10-CM

## 2021-03-29 DIAGNOSIS — M79676 Pain in unspecified toe(s): Secondary | ICD-10-CM

## 2021-03-29 DIAGNOSIS — M6281 Muscle weakness (generalized): Secondary | ICD-10-CM

## 2021-03-29 DIAGNOSIS — I951 Orthostatic hypotension: Secondary | ICD-10-CM

## 2021-03-29 DIAGNOSIS — R42 Dizziness and giddiness: Secondary | ICD-10-CM | POA: Diagnosis not present

## 2021-03-29 DIAGNOSIS — R5383 Other fatigue: Secondary | ICD-10-CM | POA: Diagnosis not present

## 2021-03-29 MED ORDER — VENLAFAXINE HCL ER 75 MG PO CP24: 75.0000 mg | ORAL_CAPSULE | Freq: Every day | ORAL | 1 refills | Status: DC

## 2021-03-29 NOTE — Patient Instructions (Signed)
Rehydration, Adult Rehydration is the replacement of body fluids, salts, and minerals (electrolytes) that are lost during dehydration. Dehydration is when there is not enough water or other fluids in the body. This happens when you lose more fluids than you take in. Common causes of dehydration include:  Not drinking enough fluids. This can occur when you are ill or doing activities that require a lot of energy, especially in hot weather.  Conditions that cause loss of water or other fluids, such as diarrhea, vomiting, sweating, or urinating a lot.  Other illnesses, such as fever or infection.  Certain medicines, such as those that remove excess fluid from the body (diuretics). Symptoms of mild or moderate dehydration may include thirst, dry lips and mouth, and dizziness. Symptoms of severe dehydration may include increased heart rate, confusion, fainting, and not urinating. For severe dehydration, you may need to get fluids through an IV at the hospital. For mild or moderate dehydration, you can usually rehydrate at home by drinking certain fluids as told by your health care provider. What are the risks? Generally, rehydration is safe. However, taking in too much fluid (overhydration) can be a problem. This is rare. Overhydration can cause an electrolyte imbalance, kidney failure, or a decrease in salt (sodium) levels in the body. Supplies needed You will need an oral rehydration solution (ORS) if your health care provider tells you to use one. This is a drink to treat dehydration. It can be found in pharmacies and retail stores. How to rehydrate Fluids Follow instructions from your health care provider for rehydration. The kind of fluid and the amount you should drink depend on your condition. In general, you should choose drinks that you prefer.  If told by your health care provider, drink an ORS. ? Make an ORS by following instructions on the package. ? Start by drinking small amounts,  about  cup (120 mL) every 5-10 minutes. ? Slowly increase how much you drink until you have taken the amount recommended by your health care provider.  Drink enough clear fluids to keep your urine pale yellow. If you were told to drink an ORS, finish it first, then start slowly drinking other clear fluids. Drink fluids such as: ? Water. This includes sparkling water and flavored water. Drinking only water can lead to having too little sodium in your body (hyponatremia). Follow the advice of your health care provider. ? Water from ice chips you suck on. ? Fruit juice with water you add to it (diluted). ? Sports drinks. ? Hot or cold herbal teas. ? Broth-based soups. ? Milk or milk products. Food Follow instructions from your health care provider about what to eat while you rehydrate. Your health care provider may recommend that you slowly begin eating regular foods in small amounts.  Eat foods that contain a healthy balance of electrolytes, such as bananas, oranges, potatoes, tomatoes, and spinach.  Avoid foods that are greasy or contain a lot of sugar. In some cases, you may get nutrition through a feeding tube that is passed through your nose and into your stomach (nasogastric tube, or NG tube). This may be done if you have uncontrolled vomiting or diarrhea.   Beverages to avoid Certain beverages may make dehydration worse. While you rehydrate, avoid drinking alcohol.   How to tell if you are recovering from dehydration You may be recovering from dehydration if:  You are urinating more often than before you started rehydrating.  Your urine is pale yellow.  Your energy level   improves.  You vomit less frequently.  You have diarrhea less frequently.  Your appetite improves or returns to normal.  You feel less dizzy or less light-headed.  Your skin tone and color start to look more normal. Follow these instructions at home:  Take over-the-counter and prescription medicines only  as told by your health care provider.  Do not take sodium tablets. Doing this can lead to having too much sodium in your body (hypernatremia). Contact a health care provider if:  You continue to have symptoms of mild or moderate dehydration, such as: ? Thirst. ? Dry lips. ? Slightly dry mouth. ? Dizziness. ? Dark urine or less urine than normal. ? Muscle cramps.  You continue to vomit or have diarrhea. Get help right away if you:  Have symptoms of dehydration that get worse.  Have a fever.  Have a severe headache.  Have been vomiting and the following happens: ? Your vomiting gets worse or does not go away. ? Your vomit includes blood or green matter (bile). ? You cannot eat or drink without vomiting.  Have problems with urination or bowel movements, such as: ? Diarrhea that gets worse or does not go away. ? Blood in your stool (feces). This may cause stool to look black and tarry. ? Not urinating, or urinating only a small amount of very dark urine, within 6-8 hours.  Have trouble breathing.  Have symptoms that get worse with treatment. These symptoms may represent a serious problem that is an emergency. Do not wait to see if the symptoms will go away. Get medical help right away. Call your local emergency services (911 in the [place] Do not drive yourself to the hospital. Summary  Rehydration is the replacement of body fluids and minerals (electrolytes) that are lost during dehydration.  Follow instructions from your health care provider for rehydration. The kind of fluid and amount you should drink depend on your condition.  Slowly increase how much you drink until you have taken the amount recommended by your health care provider.  Contact your health care provider if you continue to show signs of mild or moderate dehydration. This information is not intended to replace advice given to you by your health care provider. Make sure you discuss any questions you have with  your health care provider. Document Revised: 01/13/2020 Document Reviewed: 11/23/2019 Elsevier Patient Education  2021 [employer] Inc.  

## 2021-03-29 NOTE — Progress Notes (Signed)
This visit occurred during the SARS-CoV-2 public health emergency.  Safety protocols were in place, including screening questions prior to the visit, additional usage of staff PPE, and extensive cleaning of exam room while observing appropriate contact time as indicated for disinfecting solutions.    Darlene Benton , [birth year], 50 y.o., female MRN: [id number] Patient Care Team    Relationship Specialty Notifications Start End  Ma Hillock, DO PCP - General Family Medicine  07/25/16     Chief Complaint  Patient presents with  . Dizziness     Subjective: Pt presents for an OV with multiple complaints she is uncertain if are correlated with one another.  She has been dizzy in which she states she feels the room is spinning.  This can occur at any time when changing positions or moving her head.  She is noted a bruise on her leg she is uncertain where it came from.  She is having loose stools.  She feels like she is in a brain fog.  She is more fatigued to the point where she feels like she can even sit "upright.  "She is having some night sweats but states that is been occurring for a while.  She is feeling more emotional, and quick to cry.  She has noticed her large toe is achy.  She denies injury to her toe.  Felt short of breath intermittently.  She did take a COVID test and it was negative.  She states the majority of the symptoms have been occurring over the last week.  She denies fevers, chills, unintentional weight loss or weight gain, headache.  Depression with anxiety/sleep disturbance: Patient reports she was feeling much improved on Effexor. She has started to feel like herself again.  She has been out of her medication for a week. Prior note: Patient reports she is not sleeping well.  She feels more stressed out than she has in the past.  She is not motivated, decreased concentration unable to focus.  She is having difficulty sleeping.  She is never referred to a therapist or  have been placed on medications to help treat depression anxiety in the past.  She states that it took some time for her to come to terms that it could be depression causing her symptoms.   Depression screen [facility] 2/9 03/29/2021 09/30/2020 08/19/2020 07/25/2016  Decreased Interest 0 1 2 0  Down, Depressed, Hopeless 0 0 3 0  PHQ - 2 Score 0 1 5 0  Altered sleeping - 1 3 -  Tired, decreased energy - 2 3 -  Change in appetite - 1 3 -  Feeling bad or failure about yourself  - 0 3 -  Trouble concentrating - 1 3 -  Moving slowly or fidgety/restless - 0 1 -  Suicidal thoughts - 0 1 -  PHQ-9 Score - 6 22 -    Allergies  Allergen Reactions  . Benadryl [Diphenhydramine] Anxiety    Paradoxical reaction   Social History   Social History Narrative   Partner [place], 2 children [place] and [place]   BA. Marketing.    Caffeine use. Herbal use. Daily vitamin.    Wears her seatbelt, bicycle helmet, smoke detector in the home.    Exercises routinely.    Feels safe in her relationships.    Past Medical History:  Diagnosis Date  . Allergic rhinitis   . Allergy   . Breast mass   . Chicken pox   . Eczema   .  Headache    Hospitalize, right parotid questionable enlargement, MRI/CT and ultrasound completed was believed to be normal.  . Major depression   . Malaise   . Migraines   . Vitamin D deficiency    Past Surgical History:  Procedure Laterality Date  . WISDOM TOOTH EXTRACTION     Family History  Problem Relation Age of Onset  . Cancer Mother        adenoid  cystic carcinoma   . Osteoporosis Mother   . Osteosclerosis Mother        hearing loss in early 85s  . Alcohol abuse Father   . Arthritis Father   . Cancer Father   . Diabetes Father   . Hearing loss Father   . Heart disease Father   . Alcohol abuse Maternal Aunt   . Alcohol abuse Maternal Uncle   . Alcohol abuse Paternal Uncle    Allergies as of 03/29/2021      Reactions   Benadryl [diphenhydramine] Anxiety   Paradoxical  reaction      Medication List       Accurate as of Mar 29, 2021 11:59 PM. If you have any questions, ask your nurse or doctor.        cetirizine 10 MG tablet Commonly known as: ZYRTEC Take 10 mg by mouth daily as needed.   EPINEPHrine 0.3 mg/0.3 mL Soaj injection Commonly known as: EPI-PEN U UTD   loratadine 10 MG tablet Commonly known as: CLARITIN Take 10 mg by mouth daily as needed.   montelukast 10 MG tablet Commonly known as: SINGULAIR TK 1 T PO QD   mupirocin ointment 2 % Commonly known as: BACTROBAN Apply to affected area 1-2 x daily   triamcinolone cream 0.1 % Commonly known as: KENALOG Apply 1 application topically 2 (two) times daily.   venlafaxine XR 75 MG 24 hr capsule Commonly known as: Effexor XR Take 1 capsule (75 mg total) by mouth daily with breakfast.       All past medical history, surgical history, allergies, family history, immunizations andmedications were updated in the EMR today and reviewed under the history and medication portions of their EMR.     ROS: Negative, with the exception of above mentioned in HPI   Objective:  BP 107/67   Pulse (!) 116   Ht $R'5\' 7"'bd$  (1.702 m)   Wt 142 lb (64.4 kg)   SpO2 98%   BMI 22.24 kg/m  Body mass index is 22.24 kg/m. Gen: Afebrile. No acute distress. Nontoxic in appearance, well developed, well nourished.  HENT: AT. [place]. Bilateral TM visualized bilateral effusions - no erythema or bulging. MMM, no oral lesions. Bilateral nares pale, no erythema or drainage noted. Throat without erythema or exudates. No cough or hoarseness.  Eyes:Pupils Equal Round Reactive to light, Extraocular movements intact,  Conjunctiva without redness, discharge or icterus. Neck/lymp/endocrine: Supple,no lymphadenopathy, no thyromegaly.  CV: RRR(not tachy on exam) no murmur, no edema Chest: CTAB, no wheeze or crackles. Good air movement, normal resp effort.  Skin: no rashes, purpura or petechiae.  Neuro: Normal gait. PERLA.  EOMi. Alert. Oriented x3  Psych: Normal affect, dress and demeanor. Normal speech. Normal thought content and judgment.  No exam data present No results found. Results for orders placed or performed in visit on 03/29/21 (from the past 24 hour(s))  CBC w/Diff     Status: None   Collection Time: 03/29/21  3:02 PM  Result Value Ref Range   WBC 5.8 3.8 - 10.8  Thousand/uL   RBC 4.35 3.80 - 5.10 Million/uL   Hemoglobin 14.1 11.7 - 15.5 g/dL   HCT 41.5 35.0 - 45.0 %   MCV 95.4 80.0 - 100.0 fL   MCH 32.4 27.0 - 33.0 pg   MCHC 34.0 32.0 - 36.0 g/dL   RDW 11.7 11.0 - 15.0 %   Platelets 269 140 - 400 Thousand/uL   MPV 11.0 7.5 - 12.5 fL   Neutro Abs 3,161 1,500 - 7,800 cells/uL   Lymphs Abs 1,897 850 - 3,900 cells/uL   Absolute Monocytes 534 200 - 950 cells/uL   Eosinophils Absolute 180 15 - 500 cells/uL   Basophils Absolute 29 0 - 200 cells/uL   Neutrophils Relative % 54.5 %   Total Lymphocyte 32.7 %   Monocytes Relative 9.2 %   Eosinophils Relative 3.1 %   Basophils Relative 0.5 %  TSH     Status: None   Collection Time: 03/29/21  3:02 PM  Result Value Ref Range   TSH 2.29 mIU/L  Vitamin D (25 hydroxy)     Status: Abnormal   Collection Time: 03/29/21  3:02 PM  Result Value Ref Range   Vit D, 25-Hydroxy 20 (L) 30 - 100 ng/mL    Assessment/Plan: Auda Finfrock is a 50 y.o. female present for OV for  Depression with anxiety/sleep disturbance Had improved with use of Effexor.  However she has been out for the last week. Restart Effexor75 mg daily. Follow-up in 5.5 mos. Sooner if needed.   Fatigue/dizziness/memory changes-brain fog/muscle weakness Patient is orthostatic positive on exam today or drop in blood pressure, increase in heart rate and symptoms.  She is having some loose stools and therefore electrolyte imbalance and mild dehydration could be playing a role.  I believe some of her symptoms are multifactorial since she also abruptly stopped her Effexor within the last  week. Hydrate!  Increased water/electrolyte replacement to at least 60-80 ounces a day Restart Effexor Will rule out endocrine, electrolyte, anemia and vitamin/mineral deficiencies as potential cause. She also has bilateral effusions suggestive of allergy symptoms.  She is using her Flonase.  Encouraged antihistamine. - CBC w/Diff - Comp Met (CMET) - TSH - Vitamin D (25 hydroxy) - B12 - Iron, TIBC and Ferritin Panel Follow-up dependent upon laboratory results.   Pain of toe, unspecified laterality Uncertain etiology of her toe pain.  Possibly arthritis.  Potentially mild gout.  No history of trauma.   Will await lab results and formulate plan if symptoms persist. Add on uric acid level to labs if able Hydrate     Reviewed expectations re: course of current medical issues.  Discussed self-management of symptoms.  Outlined signs and symptoms indicating need for more acute intervention.  Patient verbalized understanding and all questions were answered.  Patient received an After-Visit Summary.    Orders Placed This Encounter  Procedures  . CBC w/Diff  . Comp Met (CMET)  . TSH  . Vitamin D (25 hydroxy)  . B12  . Iron, TIBC and Ferritin Panel   Meds ordered this encounter  Medications  . venlafaxine XR (EFFEXOR XR) 75 MG 24 hr capsule    Sig: Take 1 capsule (75 mg total) by mouth daily with breakfast.    Dispense:  90 capsule    Refill:  1   Referral Orders  No referral(s) requested today     Note is dictated utilizing voice recognition software. Although note has been proof read prior to signing, occasional typographical errors still can  be missed. If any questions arise, please do not hesitate to call for verification.   electronically signed by:  Howard Pouch, DO  [place]

## 2021-03-30 ENCOUNTER — Other Ambulatory Visit: Payer: Self-pay | Admitting: Family Medicine

## 2021-03-30 ENCOUNTER — Other Ambulatory Visit: Payer: Self-pay

## 2021-03-30 ENCOUNTER — Telehealth (INDEPENDENT_AMBULATORY_CARE_PROVIDER_SITE_OTHER): Payer: No Typology Code available for payment source | Admitting: Family Medicine

## 2021-03-30 ENCOUNTER — Encounter: Payer: Self-pay | Admitting: Family Medicine

## 2021-03-30 DIAGNOSIS — R5383 Other fatigue: Secondary | ICD-10-CM | POA: Insufficient documentation

## 2021-03-30 DIAGNOSIS — G479 Sleep disorder, unspecified: Secondary | ICD-10-CM | POA: Diagnosis not present

## 2021-03-30 DIAGNOSIS — F418 Other specified anxiety disorders: Secondary | ICD-10-CM | POA: Diagnosis not present

## 2021-03-30 DIAGNOSIS — M79676 Pain in unspecified toe(s): Secondary | ICD-10-CM | POA: Insufficient documentation

## 2021-03-30 DIAGNOSIS — I951 Orthostatic hypotension: Secondary | ICD-10-CM | POA: Insufficient documentation

## 2021-03-30 DIAGNOSIS — R413 Other amnesia: Secondary | ICD-10-CM | POA: Insufficient documentation

## 2021-03-30 DIAGNOSIS — R42 Dizziness and giddiness: Secondary | ICD-10-CM | POA: Insufficient documentation

## 2021-03-30 DIAGNOSIS — M6281 Muscle weakness (generalized): Secondary | ICD-10-CM | POA: Insufficient documentation

## 2021-03-30 LAB — COMPREHENSIVE METABOLIC PANEL
BUN: 9 mg/dL (ref 7–25)
Chloride: 104 mmol/L (ref 98–110)
Glucose, Bld: 136 mg/dL — ABNORMAL HIGH (ref 65–99)

## 2021-03-30 LAB — TEST AUTHORIZATION

## 2021-03-30 LAB — CBC WITH DIFFERENTIAL/PLATELET
Eosinophils Absolute: 180 cells/uL (ref 15–500)
Eosinophils Relative: 3.1 %
Lymphs Abs: 1897 cells/uL (ref 850–3900)
RBC: 4.35 10*6/uL (ref 3.80–5.10)

## 2021-03-30 LAB — VITAMIN B12: Vitamin B-12: 214 pg/mL (ref 211–911)

## 2021-03-30 LAB — IRON,TIBC AND FERRITIN PANEL: Iron: 123 ug/dL (ref 40–190)

## 2021-03-30 MED ORDER — VITAMIN D (ERGOCALCIFEROL) 1.25 MG (50000 UNIT) PO CAPS: 50000.0000 [IU] | ORAL_CAPSULE | ORAL | 0 refills | Status: DC

## 2021-03-30 NOTE — Patient Instructions (Signed)
[employer] Textbook [employer] Endocrinology (14th ed., pp. 574-641). [place], [place]: Elsevier.">  Perimenopause Perimenopause is the normal time of a woman's life when the levels of estrogen, the female hormone produced by the ovaries, begin to decrease. This leads to changes in menstrual periods before they stop completely (menopause). Perimenopause can begin 2-8 years before menopause. During perimenopause, the ovaries may or may not produce an egg and a woman can still become pregnant. What are the causes? This condition is caused by a natural change in hormone levels that happens as you get older. What increases the risk? This condition is more likely to start at an earlier age if you have certain medical conditions or have undergone treatments, including:  A tumor of the pituitary gland in the brain.  A disease that affects the ovaries and hormone production.  Certain cancer treatments, such as chemotherapy or hormone therapy, or radiation therapy on the pelvis.  Heavy smoking and excessive alcohol use.  Family history of early menopause. What are the signs or symptoms? Perimenopausal changes affect each woman differently. Symptoms of this condition may include:  Hot flashes.  Irregular menstrual periods.  Night sweats.  Changes in feelings about sex. This could be a decrease in sex drive or an increased discomfort around your sexuality.  Vaginal dryness.  Headaches.  Mood swings.  Depression.  Problems sleeping (insomnia).  Memory problems or trouble concentrating.  Irritability.  Tiredness.  Weight gain.  Anxiety.  Trouble getting pregnant. How is this diagnosed? This condition is diagnosed based on your medical history, a physical exam, your age, your menstrual history, and your symptoms. Hormone tests may also be done. How is this treated? In some cases, no treatment is needed. You and your health care provider should make a decision together about whether  treatment is necessary. Treatment will be based on your individual condition and preferences. Various treatments are available, such as:  Menopausal hormone therapy (MHT).  Medicines to treat specific symptoms.  Acupuncture.  Vitamin or herbal supplements. Before starting treatment, make sure to let your health care provider know if you have a personal or family history of:  Heart disease.  Breast cancer.  Blood clots.  Diabetes.  Osteoporosis. Follow these instructions at home: Medicines  Take over-the-counter and prescription medicines only as told by your health care provider.  Take vitamin supplements only as told by your health care provider.  Talk with your health care provider before starting any herbal supplements. Lifestyle  Do not use any products that contain nicotine or tobacco, such as cigarettes, e-cigarettes, and chewing tobacco. If you need help quitting, ask your health care provider.  Get at least 30 minutes of physical activity on 5 or more days each week.  Eat a balanced diet that includes fresh fruits and vegetables, whole grains, soybeans, eggs, lean meat, and low-fat dairy.  Avoid alcoholic and caffeinated beverages, as well as spicy foods. This may help prevent hot flashes.  Get 7-8 hours of sleep each night.  Dress in layers that can be removed to help you manage hot flashes.  Find ways to manage stress, such as deep breathing, meditation, or journaling.   General instructions  Keep track of your menstrual periods, including: ? When they occur. ? How heavy they are and how long they last. ? How much time passes between periods.  Keep track of your symptoms, noting when they start, how often you have them, and how long they last.  Use vaginal lubricants or moisturizers to help   with vaginal dryness and improve comfort during sex.  You can still become pregnant if you are having irregular periods. Make sure you use contraception during  perimenopause if you do not want to get pregnant.  Keep all follow-up visits. This is important. This includes any group therapy or counseling.   Contact a health care provider if:  You have heavy vaginal bleeding or pass blood clots.  Your period lasts more than 2 days longer than normal.  Your periods are recurring sooner than 21 days.  You bleed after having sex.  You have pain during sex. Get help right away if you have:  Chest pain, trouble breathing, or trouble talking.  Severe depression.  Pain when you urinate.  Severe headaches.  Vision problems. Summary  Perimenopause is the time when a woman's body begins to move into menopause. This may happen naturally or as a result of other health problems or medical treatments.  Perimenopause can begin 2-8 years before menopause, and it can last for several years.  Perimenopausal symptoms can be managed through medicines, lifestyle changes, and complementary therapies such as acupuncture. This information is not intended to replace advice given to you by your health care provider. Make sure you discuss any questions you have with your health care provider. Document Revised: 04/28/2020 Document Reviewed: 04/28/2020 Elsevier Patient Education  2021 [employer]  

## 2021-03-30 NOTE — Progress Notes (Signed)
This visit occurred during the SARS-CoV-2 public health emergency.  Safety protocols were in place, including screening questions prior to the visit, additional usage of staff PPE, and extensive cleaning of exam room while observing appropriate contact time as indicated for disinfecting solutions.    Darlene Benton , 04-Jun-1971, 50 y.o., female MRN: [id number] Patient Care Team    Relationship Specialty Notifications Start End  Ma Hillock, DO PCP - General Family Medicine  07/25/16     Chief Complaint  Patient presents with  . Dehydration  . Anxiety    Taken meds this morning     Subjective: Darlene Benton is a 50 y.o. female present for [facility] and further discussion on acute complaints.   She has restarted her effexor today. She has increased her fluid yesterday and is making an effort to increase her water consumption- she reports she does not drink much water. She does have water drops that she reports has sodium in it.  She reports her menstrual cycles are regular, but she is having severe hot flashes at night. Her mother had a complete hysterectomy, so she is uncertain when her mother started perimenopausal sx etc. She does not take a daily vitamin.    Prior note: Pt presents for an OV with multiple complaints she is uncertain if are correlated with one another.  She has been dizzy in which she states she feels the room is spinning.  This can occur at any time when changing positions or moving her head.  She is noted a bruise on her leg she is uncertain where it came from.  She is having loose stools.  She feels like she is in a brain fog.  She is more fatigued to the point where she feels like she can even sit "upright.  "She is having some night sweats but states that is been occurring for a while.  She is feeling more emotional, and quick to cry.  She has noticed her large toe is achy.  She denies injury to her toe.  Felt short of breath intermittently.  She did take a COVID test  and it was negative.  She states the majority of the symptoms have been occurring over the last week.  She denies fevers, chills, unintentional weight loss or weight gain, headache.  Depression with anxiety/sleep disturbance: Patient reports she was feeling much improved on Effexor. She has started to feel like herself again.  She has been out of her medication for a week. Prior note: Patient reports she is not sleeping well.  She feels more stressed out than she has in the past.  She is not motivated, decreased concentration unable to focus.  She is having difficulty sleeping.  She is never referred to a therapist or have been placed on medications to help treat depression anxiety in the past.  She states that it took some time for her to come to terms that it could be depression causing her symptoms.   Depression screen [facility] 2/9 03/29/2021 09/30/2020 08/19/2020 07/25/2016  Decreased Interest 0 1 2 0  Down, Depressed, Hopeless 0 0 3 0  PHQ - 2 Score 0 1 5 0  Altered sleeping - 1 3 -  Tired, decreased energy - 2 3 -  Change in appetite - 1 3 -  Feeling bad or failure about yourself  - 0 3 -  Trouble concentrating - 1 3 -  Moving slowly or fidgety/restless - 0 1 -  Suicidal thoughts -  0 1 -  PHQ-9 Score - 6 22 -    Allergies  Allergen Reactions  . Benadryl [Diphenhydramine] Anxiety    Paradoxical reaction   Social History   Social History Narrative   Partner [place], 2 children Dix and Roland   BA. Marketing.    Caffeine use. Herbal use. Daily vitamin.    Wears her seatbelt, bicycle helmet, smoke detector in the home.    Exercises routinely.    Feels safe in her relationships.    Past Medical History:  Diagnosis Date  . Allergic rhinitis   . Allergy   . Breast mass   . Chicken pox   . Eczema   . Headache    Hospitalize, right parotid questionable enlargement, MRI/CT and ultrasound completed was believed to be normal.  . Major depression   . Malaise   . Migraines   .  Vitamin D deficiency    Past Surgical History:  Procedure Laterality Date  . WISDOM TOOTH EXTRACTION     Family History  Problem Relation Age of Onset  . Cancer Mother        adenoid  cystic carcinoma   . Osteoporosis Mother   . Osteosclerosis Mother        hearing loss in early 32s  . Alcohol abuse Father   . Arthritis Father   . Cancer Father   . Diabetes Father   . Hearing loss Father   . Heart disease Father   . Alcohol abuse Maternal Aunt   . Alcohol abuse Maternal Uncle   . Alcohol abuse Paternal Uncle    Allergies as of 03/30/2021      Reactions   Benadryl [diphenhydramine] Anxiety   Paradoxical reaction      Medication List       Accurate as of Mar 30, 2021  9:30 AM. If you have any questions, ask your nurse or doctor.        cetirizine 10 MG tablet Commonly known as: ZYRTEC Take 10 mg by mouth daily as needed.   EPINEPHrine 0.3 mg/0.3 mL Soaj injection Commonly known as: EPI-PEN U UTD   loratadine 10 MG tablet Commonly known as: CLARITIN Take 10 mg by mouth daily as needed.   montelukast 10 MG tablet Commonly known as: SINGULAIR TK 1 T PO QD   mupirocin ointment 2 % Commonly known as: BACTROBAN Apply to affected area 1-2 x daily   triamcinolone cream 0.1 % Commonly known as: KENALOG Apply 1 application topically 2 (two) times daily.   venlafaxine XR 75 MG 24 hr capsule Commonly known as: Effexor XR Take 1 capsule (75 mg total) by mouth daily with breakfast.   Vitamin D (Ergocalciferol) 1.25 MG (50000 UNIT) Caps capsule Commonly known as: DRISDOL Take 1 capsule (50,000 Units total) by mouth every 7 (seven) days. Started by: Howard Pouch, DO       All past medical history, surgical history, allergies, family history, immunizations andmedications were updated in the EMR today and reviewed under the history and medication portions of their EMR.     ROS: Negative, with the exception of above mentioned in HPI   Objective:  There were no  vitals taken for this visit. There is no height or weight on file to calculate BMI. Gen: Afebrile. No acute distress.  HENT: AT. [place].  Eyes:Pupils Equal Round Reactive to light, Extraocular movements intact,  Conjunctiva without redness, discharge or icterus..  Neuro:  Normal gait. PERLA. EOMi. Alert. Oriented. x3 Psych: Normal affect, dress  and demeanor. Normal speech. Normal thought content and judgment..   No exam data present No results found. Results for orders placed or performed in visit on 03/29/21 (from the past 24 hour(s))  CBC w/Diff     Status: None   Collection Time: 03/29/21  3:02 PM  Result Value Ref Range   WBC 5.8 3.8 - 10.8 Thousand/uL   RBC 4.35 3.80 - 5.10 Million/uL   Hemoglobin 14.1 11.7 - 15.5 g/dL   HCT 41.5 35.0 - 45.0 %   MCV 95.4 80.0 - 100.0 fL   MCH 32.4 27.0 - 33.0 pg   MCHC 34.0 32.0 - 36.0 g/dL   RDW 11.7 11.0 - 15.0 %   Platelets 269 140 - 400 Thousand/uL   MPV 11.0 7.5 - 12.5 fL   Neutro Abs 3,161 1,500 - 7,800 cells/uL   Lymphs Abs 1,897 850 - 3,900 cells/uL   Absolute Monocytes 534 200 - 950 cells/uL   Eosinophils Absolute 180 15 - 500 cells/uL   Basophils Absolute 29 0 - 200 cells/uL   Neutrophils Relative % 54.5 %   Total Lymphocyte 32.7 %   Monocytes Relative 9.2 %   Eosinophils Relative 3.1 %   Basophils Relative 0.5 %  TSH     Status: None   Collection Time: 03/29/21  3:02 PM  Result Value Ref Range   TSH 2.29 mIU/L  Vitamin D (25 hydroxy)     Status: Abnormal   Collection Time: 03/29/21  3:02 PM  Result Value Ref Range   Vit D, 25-Hydroxy 20 (L) 30 - 100 ng/mL    Assessment/Plan: Darlene Benton is a 50 y.o. female present for OV for  Depression with anxiety/sleep disturbance/hot flashes Restarted effexor today.  Restart Effexor75 mg daily. Discussed HRT vs increase in effexor for hot flashes> she will think on this and let us know when we call her with lab results if she would like to increase effexor.  Declines HRT. Follow-up  in 5.5 mos. Sooner if needed.   Fatigue/dizziness/memory changes-brain fog/muscle weakness Patient is orthostatic positive on exam today or drop in blood pressure, increase in heart rate and symptoms.  She is having some loose stools and therefore electrolyte imbalance and mild dehydration could be playing a role.  I believe some of her symptoms are multifactorial since she also abruptly stopped her Effexor within the last week. Hydrate!  Increased water/electrolyte replacement to at least 60-80 ounces a day Restarted Effexor She also has bilateral effusions suggestive of allergy symptoms.  She is using her Flonase.  Encouraged antihistamine. - CBC w/Diff>normal.  - Comp Met (CMET)> pending. - TSH> normal - Vitamin D (25 hydroxy)> abnormal Low 20 - B12> pending - Iron, TIBC and Ferritin Panel> pending Follow-up dependent upon laboratory results.  Pain of toe, unspecified laterality Uncertain etiology of her toe pain.  Possibly arthritis.  Potentially mild gout.  No history of trauma.   Will await lab results and formulate plan if symptoms persist. uric acid> pending Hydrate  Vit d def: New.  Discussed OTC vit d 1000u daily (skip day of high dose), once ergo cal completed take every day.  Start ergo cal 50K once weekly with food for 8 weeks.   F/u dependent on remaining of lab results.      Reviewed expectations re: course of current medical issues.  Discussed self-management of symptoms.  Outlined signs and symptoms indicating need for more acute intervention.  Patient verbalized understanding and all questions were answered.  Patient received  an Scientific laboratory technician.    No orders of the defined types were placed in this encounter.  Meds ordered this encounter  Medications  . Vitamin D, Ergocalciferol, (DRISDOL) 1.25 MG (50000 UNIT) CAPS capsule    Sig: Take 1 capsule (50,000 Units total) by mouth every 7 (seven) days.    Dispense:  8 capsule    Refill:  0    Referral Orders  No referral(s) requested today     Note is dictated utilizing voice recognition software. Although note has been proof read prior to signing, occasional typographical errors still can be missed. If any questions arise, please do not hesitate to call for verification.   electronically signed by:  Howard Pouch, DO  [place]

## 2021-03-31 ENCOUNTER — Telehealth: Payer: Self-pay | Admitting: Family Medicine

## 2021-03-31 LAB — COMPREHENSIVE METABOLIC PANEL
AG Ratio: 1.9 (calc) (ref 1.0–2.5)
ALT: 15 U/L (ref 6–29)
AST: 16 U/L (ref 10–35)
Albumin: 4.8 g/dL (ref 3.6–5.1)
Alkaline phosphatase (APISO): 47 U/L (ref 31–125)
CO2: 26 mmol/L (ref 20–32)
Calcium: 9.7 mg/dL (ref 8.6–10.2)
Creat: 0.69 mg/dL (ref 0.50–1.10)
Globulin: 2.5 g/dL (calc) (ref 1.9–3.7)
Potassium: 4.2 mmol/L (ref 3.5–5.3)
Sodium: 139 mmol/L (ref 135–146)
Total Bilirubin: 0.6 mg/dL (ref 0.2–1.2)
Total Protein: 7.3 g/dL (ref 6.1–8.1)

## 2021-03-31 LAB — TEST AUTHORIZATION

## 2021-03-31 LAB — CBC WITH DIFFERENTIAL/PLATELET
Absolute Monocytes: 534 cells/uL (ref 200–950)
Basophils Absolute: 29 cells/uL (ref 0–200)
Basophils Relative: 0.5 %
HCT: 41.5 % (ref 35.0–45.0)
Hemoglobin: 14.1 g/dL (ref 11.7–15.5)
MCH: 32.4 pg (ref 27.0–33.0)
MCHC: 34 g/dL (ref 32.0–36.0)
MCV: 95.4 fL (ref 80.0–100.0)
MPV: 11 fL (ref 7.5–12.5)
Monocytes Relative: 9.2 %
Neutro Abs: 3161 cells/uL (ref 1500–7800)
Neutrophils Relative %: 54.5 %
Platelets: 269 10*3/uL (ref 140–400)
RDW: 11.7 % (ref 11.0–15.0)
Total Lymphocyte: 32.7 %
WBC: 5.8 10*3/uL (ref 3.8–10.8)

## 2021-03-31 LAB — IRON,TIBC AND FERRITIN PANEL
%SAT: 34 % (calc) (ref 16–45)
Ferritin: 40 ng/mL (ref 16–232)
TIBC: 361 mcg/dL (calc) (ref 250–450)

## 2021-03-31 LAB — URIC ACID: Uric Acid, Serum: 2.6 mg/dL (ref 2.5–7.0)

## 2021-03-31 LAB — VITAMIN D 25 HYDROXY (VIT D DEFICIENCY, FRACTURES): Vit D, 25-Hydroxy: 20 ng/mL — ABNORMAL LOW (ref 30–100)

## 2021-03-31 LAB — TSH: TSH: 2.29 mIU/L

## 2021-03-31 NOTE — Telephone Encounter (Signed)
LVM for pt to CB regarding results.  

## 2021-03-31 NOTE — Telephone Encounter (Signed)
Please inform patient: Liver, kidney and electrolyte levels are all normal. Iron levels are normal. Blood cell counts including platelets are normal. Thyroid function is normal.  We discussed her vitamin D level during her visit yesterday and she will start the prescribed supplement. Her B12 is also significantly low at 214.  People will have symptoms of fatigue, muscle aches numbness and tingling, headaches and brain fog with levels this low.  Typically strive to have levels greater than 500 at least. -People with levels less than the likely do not absorb B12 well from the stomach lining.  There are 2 options for B12 replacement in this population and that is the sublingual B12 which is placed underneath the tongue and comes in a solution and a dissolvable tablet over-the-counter.  Dose should be approximately 1000 mcg daily.  -The other option is that we can also offer her B12 injections every 2 weeks for 4 doses to also increase her levels into normal range, and then she can take the sublingual version to keep her levels normal.  If she elects not to take the injections that is okay, start the over-the-counter B12 1000 mcg sublingual ASAP.  Please schedule her for provider follow-up in 8 weeks

## 2021-04-03 NOTE — Telephone Encounter (Signed)
Called pt and left VM to return call to office. -Jma

## 2021-04-03 NOTE — Telephone Encounter (Signed)
Spoke with patient regarding results/recommendations.  

## 2021-06-05 ENCOUNTER — Ambulatory Visit: Payer: No Typology Code available for payment source | Admitting: Family Medicine

## 2021-06-23 ENCOUNTER — Telehealth (INDEPENDENT_AMBULATORY_CARE_PROVIDER_SITE_OTHER): Payer: No Typology Code available for payment source | Admitting: Family Medicine

## 2021-06-23 ENCOUNTER — Encounter: Payer: Self-pay | Admitting: Family Medicine

## 2021-06-23 ENCOUNTER — Other Ambulatory Visit: Payer: Self-pay

## 2021-06-23 DIAGNOSIS — R413 Other amnesia: Secondary | ICD-10-CM | POA: Diagnosis not present

## 2021-06-23 DIAGNOSIS — E559 Vitamin D deficiency, unspecified: Secondary | ICD-10-CM | POA: Insufficient documentation

## 2021-06-23 DIAGNOSIS — E538 Deficiency of other specified B group vitamins: Secondary | ICD-10-CM | POA: Insufficient documentation

## 2021-06-23 DIAGNOSIS — R5383 Other fatigue: Secondary | ICD-10-CM | POA: Diagnosis not present

## 2021-06-23 DIAGNOSIS — F418 Other specified anxiety disorders: Secondary | ICD-10-CM

## 2021-06-23 MED ORDER — VENLAFAXINE HCL ER 75 MG PO CP24: 75.0000 mg | ORAL_CAPSULE | Freq: Every day | ORAL | 1 refills | Status: DC

## 2021-06-23 NOTE — Progress Notes (Signed)
This visit occurred during the SARS-CoV-2 public health emergency.  Safety protocols were in place, including screening questions prior to the visit, additional usage of staff PPE, and extensive cleaning of exam room while observing appropriate contact time as indicated for disinfecting solutions.    Galia Rahm , 11/30/1970, 50 y.o., female MRN: [id number] Patient Care Team    Relationship Specialty Notifications Start End  Natalia Leatherwood, DO PCP - General Family Medicine  07/25/16     Chief Complaint  Patient presents with   b12 def    Lab appt sched 8/4     Subjective: Shelaine Frie is a 50 y.o. female present for follow up Vit d and b12 def: Pt was found to be deficient in vit d and b12 when she presented for fatigue, dizziness and memory changes. She reports she is now taking vit d3 OTC daily and b12 1000 mcg sublingual. She reports her symptoms have completely resolved.   Depression with anxiety/sleep disturbance: Patient reports she was feeling much better since restarting effexor.  She has started to feel like herself again.  She state complete 180 from last seen. Prior note: Patient reports she is not sleeping well.  She feels more stressed out than she has in the past.  She is not motivated, decreased concentration unable to focus.  She is having difficulty sleeping.  She is never referred to a therapist or have been placed on medications to help treat depression anxiety in the past.  She states that it took some time for her to come to terms that it could be depression causing her symptoms.    Depression screen [facility] 2/9 03/29/2021 09/30/2020 08/19/2020 07/25/2016  Decreased Interest 0 1 2 0  Down, Depressed, Hopeless 0 0 3 0  PHQ - 2 Score 0 1 5 0  Altered sleeping - 1 3 -  Tired, decreased energy - 2 3 -  Change in appetite - 1 3 -  Feeling bad or failure about yourself  - 0 3 -  Trouble concentrating - 1 3 -  Moving slowly or fidgety/restless - 0 1 -  Suicidal  thoughts - 0 1 -  PHQ-9 Score - 6 22 -    Allergies  Allergen Reactions   Benadryl [Diphenhydramine] Anxiety    Paradoxical reaction   Social History   Social History Narrative   Partner [place], 2 children Maitland and Kenvir   BA. Marketing.    Caffeine use. Herbal use. Daily vitamin.    Wears her seatbelt, bicycle helmet, smoke detector in the home.    Exercises routinely.    Feels safe in her relationships.    Past Medical History:  Diagnosis Date   Allergic rhinitis    Allergy    Breast mass    Chicken pox    Eczema    Headache    Hospitalize, right parotid questionable enlargement, MRI/CT and ultrasound completed was believed to be normal.   Major depression    Malaise    Migraines    Vitamin D deficiency    Past Surgical History:  Procedure Laterality Date   WISDOM TOOTH EXTRACTION     Family History  Problem Relation Age of Onset   Cancer Mother        adenoid  cystic carcinoma    Osteoporosis Mother    Osteosclerosis Mother        hearing loss in early 41s   Alcohol abuse Father    Arthritis Father  Cancer Father    Diabetes Father    Hearing loss Father    Heart disease Father    Alcohol abuse Maternal Aunt    Alcohol abuse Maternal Uncle    Alcohol abuse Paternal Uncle    Allergies as of 06/23/2021       Reactions   Benadryl [diphenhydramine] Anxiety   Paradoxical reaction        Medication List        Accurate as of June 23, 2021 10:17 AM. If you have any questions, ask your nurse or doctor.          STOP taking these medications    Vitamin D (Ergocalciferol) 1.25 MG (50000 UNIT) Caps capsule Commonly known as: DRISDOL Stopped by: Felix Pacini, DO       TAKE these medications    cetirizine 10 MG tablet Commonly known as: ZYRTEC Take 10 mg by mouth daily as needed.   EPINEPHrine 0.3 mg/0.3 mL Soaj injection Commonly known as: EPI-PEN U UTD   loratadine 10 MG tablet Commonly known as: CLARITIN Take 10 mg by mouth  daily as needed.   montelukast 10 MG tablet Commonly known as: SINGULAIR TK 1 T PO QD   mupirocin ointment 2 % Commonly known as: BACTROBAN Apply to affected area 1-2 x daily What changed:  when to take this reasons to take this   triamcinolone cream 0.1 % Commonly known as: KENALOG Apply 1 application topically 2 (two) times daily.   venlafaxine XR 75 MG 24 hr capsule Commonly known as: Effexor XR Take 1 capsule (75 mg total) by mouth daily with breakfast.   Vitamin D3 125 MCG (5000 UT) Caps Take by mouth.        All past medical history, surgical history, allergies, family history, immunizations andmedications were updated in the EMR today and reviewed under the history and medication portions of their EMR.     ROS: Negative, with the exception of above mentioned in HPI   Objective:  There were no vitals taken for this visit. There is no height or weight on file to calculate BMI. Gen: Afebrile. No acute distress.  HENT: AT. [place].  Eyes:Pupils Equal Round Reactive to light, Extraocular movements intact,  Conjunctiva without redness, discharge or icterus. Neuro: normal gait. PERLA. EOMi. Alert. Orientedx3 Psych: Normal affect, dress and demeanor. Normal speech. Normal thought content and judgment.   No results found. No results found. No results found for this or any previous visit (from the past 24 hour(s)).   Assessment/Plan: Lindey Renzulli is a 50 y.o. female present for OV for  Depression with anxiety/sleep disturbance Feeling much better Continue Effexor75 mg daily. Follow-up in 5.5 mos. Sooner if needed.   B12/vit d def: Pt states all her symptoms have resolved She would like to not retest labs- insurance is not covering. Agree since she is feeling so well. Continue vit d and b12 SL QD.     Reviewed expectations re: course of current medical issues. Discussed self-management of symptoms. Outlined signs and symptoms indicating need for more acute  intervention. Patient verbalized understanding and all questions were answered. Patient received an After-Visit Summary.    No orders of the defined types were placed in this encounter.  Meds ordered this encounter  Medications   venlafaxine XR (EFFEXOR XR) 75 MG 24 hr capsule    Sig: Take 1 capsule (75 mg total) by mouth daily with breakfast.    Dispense:  90 capsule    Refill:  1  Referral Orders  No referral(s) requested today     Note is dictated utilizing voice recognition software. Although note has been proof read prior to signing, occasional typographical errors still can be missed. If any questions arise, please do not hesitate to call for verification.   electronically signed by:  Howard Pouch, DO  [place]

## 2021-06-23 NOTE — Patient Instructions (Signed)
Vitamin B12 Deficiency Vitamin B12 deficiency means that your body does not have enough vitamin B12. The body needs this vitamin: To make red blood cells. To make genes (DNA). To help the nerves work. If you do not have enough vitamin B12 in your body, you can have health problems. What are the causes? Not eating enough foods that contain vitamin B12. Not being able to absorb vitamin B12 from the food that you eat. Certain digestive system diseases. A condition in which the body does not make enough of a certain protein, which results in too few red blood cells (pernicious anemia). Having a surgery in which part of the stomach or small intestine is removed. Taking medicines that make it hard for the body to absorb vitamin B12. These medicines include: Heartburn medicines. Some antibiotic medicines. Other medicines that are used to treat certain conditions. What increases the risk? Being older than age 50. Eating a vegetarian or vegan diet, especially while you are pregnant. Eating a poor diet while you are pregnant. Taking certain medicines. Having alcoholism. What are the signs or symptoms? In some cases, there are no symptoms. If the condition leads to too few blood cells or nerve damage, symptoms can occur, such as: Feeling weak. Feeling tired (fatigued). Not being hungry. Weight loss. A loss of feeling (numbness) or tingling in your hands and feet. Redness and burning of the tongue. Being mixed up (confused) or having memory problems. Sadness (depression). Problems with your senses. This can include color blindness, ringing in the ears, or loss of taste. Watery poop (diarrhea) or trouble pooping (constipation). Trouble walking. If anemia is very bad, symptoms can include: Being short of breath. Being dizzy. Having a very fast heartbeat. How is this treated? Changing the way you eat and drink, such as: Eating more foods that contain vitamin B12. Drinking little or no  alcohol. Getting vitamin B12 shots. Taking vitamin B12 supplements. Your doctor will tell you the dose that is best for you. Follow these instructions at home: Eating and drinking  Eat lots of healthy foods that contain vitamin B12. These include: Meats and poultry, such as beef, pork, chicken, turkey, and organ meats, such as liver. Seafood, such as clams, rainbow trout, salmon, tuna, and haddock. Eggs. Cereal and dairy products that have vitamin B12 added to them. Check the label. The items listed above may not be a complete list of what you can eat and drink. Contact a dietitian for more options. General instructions Get any shots as told by your doctor. Take supplements only as told by your doctor. Do not drink alcohol if your doctor tells you not to. In some cases, you may only be asked to limit alcohol use. Keep all follow-up visits as told by your doctor. This is important. Contact a doctor if: Your symptoms come back. Get help right away if: You have trouble breathing. You have a very fast heartbeat. You have chest pain. You get dizzy. You pass out. Summary Vitamin B12 deficiency means that your body is not getting enough vitamin B12. In some cases, there are no symptoms of this condition. Treatment may include making a change in the way you eat and drink, getting vitamin B12 shots, or taking supplements. Eat lots of healthy foods that contain vitamin B12. This information is not intended to replace advice given to you by your health care provider. Make sure you discuss any questions you have with your health care provider. Document Revised: 07/22/2018 Document Reviewed: 07/22/2018 Elsevier Patient Education    2022 [employer] Inc.  

## 2021-06-29 ENCOUNTER — Ambulatory Visit: Payer: No Typology Code available for payment source

## 2021-10-02 ENCOUNTER — Telehealth: Payer: Self-pay

## 2021-10-02 NOTE — Telephone Encounter (Signed)
Retrieved voicemail message from 09/29/21 at 11:01AM  Patient states she is out of meds, she called pharmacy and was told Dr. Claiborne Billings declined authorization to refill. She states this is time sensitive meds, and she will have side effects if she does not take it daily.  Please call [phone]    venlafaxine XR (EFFEXOR XR) 75 MG 24 hr capsule [[id number]]

## 2021-10-02 NOTE — Telephone Encounter (Signed)
LVM for pt to call pharmacy regarding medication . Pt was sent 6 mos supply in July and will need appt afterwards

## 2022-03-30 ENCOUNTER — Other Ambulatory Visit: Payer: Self-pay

## 2022-03-30 MED ORDER — VENLAFAXINE HCL ER 75 MG PO CP24: 75.0000 mg | ORAL_CAPSULE | Freq: Every day | ORAL | 0 refills | Status: DC

## 2022-04-10 ENCOUNTER — Other Ambulatory Visit: Payer: Self-pay | Admitting: Family Medicine

## 2022-04-10 DIAGNOSIS — R921 Mammographic calcification found on diagnostic imaging of breast: Secondary | ICD-10-CM

## 2022-04-20 ENCOUNTER — Ambulatory Visit
Admission: RE | Admit: 2022-04-20 | Discharge: 2022-04-20 | Disposition: A | Discharge status: Home / Self Care | Payer: No Typology Code available for payment source | Source: Ambulatory Visit | Attending: Family Medicine | Admitting: Family Medicine

## 2022-04-20 DIAGNOSIS — R921 Mammographic calcification found on diagnostic imaging of breast: Secondary | ICD-10-CM

## 2022-04-30 ENCOUNTER — Other Ambulatory Visit: Payer: Self-pay

## 2022-04-30 MED ORDER — VENLAFAXINE HCL ER 75 MG PO CP24: 75.0000 mg | ORAL_CAPSULE | Freq: Every day | ORAL | 0 refills | Status: DC

## 2022-05-14 ENCOUNTER — Encounter: Payer: No Typology Code available for payment source | Admitting: Family Medicine

## 2022-05-21 ENCOUNTER — Other Ambulatory Visit: Payer: Self-pay

## 2022-05-21 MED ORDER — VENLAFAXINE HCL ER 75 MG PO CP24: 75.0000 mg | ORAL_CAPSULE | Freq: Every day | ORAL | 0 refills | Status: DC

## 2022-05-30 ENCOUNTER — Other Ambulatory Visit: Payer: Self-pay

## 2022-05-30 ENCOUNTER — Encounter: Payer: No Typology Code available for payment source | Admitting: Family Medicine

## 2022-05-30 MED ORDER — VENLAFAXINE HCL ER 75 MG PO CP24: 75.0000 mg | ORAL_CAPSULE | Freq: Every day | ORAL | 0 refills | Status: DC

## 2022-06-08 ENCOUNTER — Encounter: Payer: Self-pay | Admitting: Family Medicine

## 2022-06-08 ENCOUNTER — Ambulatory Visit (INDEPENDENT_AMBULATORY_CARE_PROVIDER_SITE_OTHER): Payer: No Typology Code available for payment source | Admitting: Family Medicine

## 2022-06-08 VITALS — BP 108/73 | HR 79 | Temp 98.3°F | Ht 66.0 in | Wt 149.0 lb

## 2022-06-08 DIAGNOSIS — R1310 Dysphagia, unspecified: Secondary | ICD-10-CM

## 2022-06-08 DIAGNOSIS — Z0000 Encounter for general adult medical examination without abnormal findings: Secondary | ICD-10-CM | POA: Diagnosis not present

## 2022-06-08 DIAGNOSIS — Z1231 Encounter for screening mammogram for malignant neoplasm of breast: Secondary | ICD-10-CM

## 2022-06-08 DIAGNOSIS — E559 Vitamin D deficiency, unspecified: Secondary | ICD-10-CM | POA: Diagnosis not present

## 2022-06-08 DIAGNOSIS — E538 Deficiency of other specified B group vitamins: Secondary | ICD-10-CM

## 2022-06-08 DIAGNOSIS — F418 Other specified anxiety disorders: Secondary | ICD-10-CM

## 2022-06-08 DIAGNOSIS — Z1211 Encounter for screening for malignant neoplasm of colon: Secondary | ICD-10-CM | POA: Diagnosis not present

## 2022-06-08 DIAGNOSIS — R928 Other abnormal and inconclusive findings on diagnostic imaging of breast: Secondary | ICD-10-CM

## 2022-06-08 DIAGNOSIS — Z79899 Other long term (current) drug therapy: Secondary | ICD-10-CM

## 2022-06-08 MED ORDER — VENLAFAXINE HCL ER 75 MG PO CP24: 75.0000 mg | ORAL_CAPSULE | Freq: Every day | ORAL | 1 refills | Status: DC

## 2022-06-08 NOTE — Progress Notes (Signed)
Patient ID: Darlene Benton, female  DOB: [birth year], 51 y.o.   MRN: [id number] Patient Care Team    Relationship Specialty Notifications Start End  Natalia Leatherwood, DO PCP - General Family Medicine  07/25/16     Chief Complaint  Patient presents with   Annual Exam    Pt is fasting    Subjective: Darlene Benton is a 51 y.o.  Female  present for CPE and CMC. All past medical history, surgical history, allergies, family history, immunizations, medications and social history were updated in the electronic medical record today. All recent labs, ED visits and hospitalizations within the last year were reviewed.  Health maintenance:  Colonoscopy:no fhx colon cancer, screen at 47> due. Desires cologuard today Mammogram: 04/20/2022 abnormal> rpt benign. [facility].> ordered today Cervical cancer screening: last pap 09/2014, abnormal once early in life, all normal since. Scheduling. She is aware. Immunizations: tdap 2014, Influenza, (encouraged yearly).covid completed. Shingrix after 01/23/2023 ok by nurse visit.  Infectious disease screening: HIV and hep c screen completed DEXA: n/a, mother with osteoporosis  Assistive device: none Oxygen IRW:ERXV Patient has a Dental home. Hospitalizations/ED visits: reviewed  Depression/anxiety: Pt reports compliance with effexor 75 mg qd. She denies side effects. She reports she is going through  a harder time right now, but does not want to increase medications at this time.  Dysphagia: Patient reports she is noticed difficulty swallowing.  She has noticed certain types of foods will get stuck in her throat.  She discussed this with her allergist who encouraged her to have a gastroenterology for referral for EGD.She was referred for this in 08/19/2020 and did not schedule. She would to have referral placed to Dr. Loreta Ave to discuss.     06/08/2022    3:23 PM 03/29/2021    2:51 PM 09/30/2020    8:44 AM 08/19/2020    2:47 PM 07/25/2016    9:24 AM  Depression screen  PHQ 2/9  Decreased Interest 1 0 1 2 0  Down, Depressed, Hopeless 1 0 0 3 0  PHQ - 2 Score 2 0 1 5 0  Altered sleeping 2  1 3    Tired, decreased energy 2  2 3    Change in appetite 0  1 3   Feeling bad or failure about yourself  1  0 3   Trouble concentrating 2  1 3    Moving slowly or fidgety/restless 0  0 1   Suicidal thoughts 0  0 1   PHQ-9 Score 9  6 22        06/08/2022    3:26 PM 09/30/2020    8:45 AM 08/19/2020    2:47 PM  GAD 7 : Generalized Anxiety Score  Nervous, Anxious, on Edge 2 1 3   Control/stop worrying 0 0 2  Worry too much - different things 0 0 3  Trouble relaxing 0 0 2  Restless 0 0 1  Easily annoyed or irritable 1 1 3   Afraid - awful might happen 0 0 2  Total GAD 7 Score 3 2 16            Immunization History  Administered Date(s) Administered   PFIZER(Purple Top)SARS-COV-2 Vaccination 02/24/2020, 03/15/2020, 11/16/2020   Tdap 11/26/2012     Past Medical History:  Diagnosis Date   Allergic rhinitis    Allergy    Breast mass    Chicken pox    Eczema    FH: deafness or hearing loss 08/03/2016   Headache    Hospitalize,  right parotid questionable enlargement, MRI/CT and ultrasound completed was believed to be normal.   Major depression    Malaise    Migraines    Sleep disturbance 09/30/2020   Vitamin D deficiency    Allergies  Allergen Reactions   Benadryl [Diphenhydramine] Anxiety    Paradoxical reaction   Past Surgical History:  Procedure Laterality Date   WISDOM TOOTH EXTRACTION     Family History  Problem Relation Age of Onset   Cancer Mother        adenoid  cystic carcinoma    Osteoporosis Mother    Osteosclerosis Mother        hearing loss in early 42s   Alcohol abuse Father    Arthritis Father    Cancer Father    Diabetes Father    Hearing loss Father    Heart disease Father    Alcohol abuse Maternal Aunt    Alcohol abuse Maternal Uncle    Alcohol abuse Paternal Uncle    Social History   Social History Narrative    Partner [place], 2 children [place] and [place]   BA. Marketing.    Caffeine use. Herbal use. Daily vitamin.    Wears her seatbelt, bicycle helmet, smoke detector in the home.    Exercises routinely.    Feels safe in her relationships.     Allergies as of 06/08/2022       Reactions   Benadryl [diphenhydramine] Anxiety   Paradoxical reaction        Medication List        Accurate as of June 08, 2022  4:14 PM. If you have any questions, ask your nurse or doctor.          cetirizine 10 MG tablet Commonly known as: ZYRTEC Take 10 mg by mouth daily as needed.   EPINEPHrine 0.3 mg/0.3 mL Soaj injection Commonly known as: EPI-PEN U UTD   loratadine 10 MG tablet Commonly known as: CLARITIN Take 10 mg by mouth daily as needed.   montelukast 10 MG tablet Commonly known as: SINGULAIR TK 1 T PO QD   mupirocin ointment 2 % Commonly known as: BACTROBAN Apply to affected area 1-2 x daily What changed:  when to take this reasons to take this   triamcinolone cream 0.1 % Commonly known as: KENALOG Apply 1 application topically 2 (two) times daily.   venlafaxine XR 75 MG 24 hr capsule Commonly known as: Effexor XR Take 1 capsule (75 mg total) by mouth daily with breakfast.   Vitamin D3 125 MCG (5000 UT) Caps Take by mouth.        All past medical history, surgical history, allergies, family history, immunizations andmedications were updated in the EMR today and reviewed under the history and medication portions of their EMR.     No results found for this or any previous visit (from the past 2160 hour(s)).  MM DIAG BREAST TOMO BILATERAL Result Date: 04/20/2022 RECOMMENDATION: Bilateral screening mammogram in 1 year. I have discussed the findings and recommendations with the patient. If applicable, a reminder letter will be sent to the patient regarding the next appointment. BI-RADS CATEGORY  2: Benign. Electronically Signed   By: Harmon Pier M.D.   On: 04/20/2022  12:05  ROS 14 pt review of systems performed and negative (unless mentioned in an HPI)  Objective: BP 108/73   Pulse 79   Temp 98.3 F (36.8 C) (Oral)   Ht 5\' 6"  (1.676 m)   Wt 149 lb (67.6  kg)   LMP 06/06/2022   SpO2 99%   BMI 24.05 kg/m  Physical Exam Vitals and nursing note reviewed.  Constitutional:      General: She is not in acute distress.    Appearance: Normal appearance. She is not ill-appearing or toxic-appearing.  HENT:     Head: Normocephalic and atraumatic.     Right Ear: Tympanic membrane, ear canal and external ear normal. There is no impacted cerumen.     Left Ear: Tympanic membrane, ear canal and external ear normal. There is no impacted cerumen.     Nose: No congestion or rhinorrhea.     Mouth/Throat:     Mouth: Mucous membranes are moist.     Pharynx: Oropharynx is clear. No oropharyngeal exudate or posterior oropharyngeal erythema.  Eyes:     General: No scleral icterus.       Right eye: No discharge.        Left eye: No discharge.     Extraocular Movements: Extraocular movements intact.     Conjunctiva/sclera: Conjunctivae normal.     Pupils: Pupils are equal, round, and reactive to light.  Cardiovascular:     Rate and Rhythm: Normal rate and regular rhythm.     Pulses: Normal pulses.     Heart sounds: Normal heart sounds. No murmur heard.    No friction rub. No gallop.  Pulmonary:     Effort: Pulmonary effort is normal. No respiratory distress.     Breath sounds: Normal breath sounds. No stridor. No wheezing, rhonchi or rales.  Chest:     Chest wall: No tenderness.  Abdominal:     General: Abdomen is flat. Bowel sounds are normal. There is no distension.     Palpations: Abdomen is soft. There is no mass.     Tenderness: There is no abdominal tenderness. There is no right CVA tenderness, left CVA tenderness, guarding or rebound.     Hernia: No hernia is present.  Musculoskeletal:        General: No swelling, tenderness or deformity. Normal  range of motion.     Cervical back: Normal range of motion and neck supple. No rigidity or tenderness.     Right lower leg: No edema.     Left lower leg: No edema.  Lymphadenopathy:     Cervical: No cervical adenopathy.  Skin:    General: Skin is warm and dry.     Coloration: Skin is not jaundiced or pale.     Findings: No bruising, erythema, lesion or rash.  Neurological:     General: No focal deficit present.     Mental Status: She is alert and oriented to person, place, and time. Mental status is at baseline.     Cranial Nerves: No cranial nerve deficit.     Sensory: No sensory deficit.     Motor: No weakness.     Coordination: Coordination normal.     Gait: Gait normal.     Deep Tendon Reflexes: Reflexes normal.  Psychiatric:        Mood and Affect: Mood normal.        Behavior: Behavior normal.        Thought Content: Thought content normal.        Judgment: Judgment normal.       No results found.  Assessment/plan: Darlene Benton is a 51 y.o. female present for CPE/CMC Depression with anxiety Stable Continue effexor 75 mg qd - TSH F/u 5.5 mos.  Vitamin D deficiency -  VITAMIN D 25 Hydroxy (Vit-D Deficiency, Fractures) B12 deficiency - Vitamin B12 Colon cancer screening - Cologuard; Future Breast cancer screening by mammogram - MM 3D SCREEN BREAST BILATERAL; Future Dysphagia, unspecified type Referral to Dr. Loreta Ave placed Routine general medical examination at a health care facility Colonoscopy:no fhx colon cancer, screen at 47> due. Desires cologuard today Mammogram: 04/20/2022 abnormal> rpt benign. [facility].> ordered today Cervical cancer screening: last pap 09/2014, abnormal once early in life, all normal since. Scheduling. She is aware. Immunizations: UTDShingrix after 01/23/2023 ok by nurse visit.  Infectious disease screening: HIV and hep c screen completed DEXA: n/a, mother with osteoporosis  Patient was encouraged to exercise greater than 150 minutes a week.  Patient was encouraged to choose a diet filled with fresh fruits and vegetables, and lean meats. AVS provided to patient today for education/recommendation on gender specific health and safety maintenance.  Return in about 24 weeks (around 11/23/2022) for Routine chronic condition follow-up.    Orders Placed This Encounter  Procedures   MM 3D SCREEN BREAST BILATERAL   CBC   Comprehensive metabolic panel   Hemoglobin A1c   Lipid panel   TSH   VITAMIN D 25 Hydroxy (Vit-D Deficiency, Fractures)   Vitamin B12   Cologuard   Ambulatory referral to Gastroenterology   Meds ordered this encounter  Medications   venlafaxine XR (EFFEXOR XR) 75 MG 24 hr capsule    Sig: Take 1 capsule (75 mg total) by mouth daily with breakfast.    Dispense:  90 capsule    Refill:  1   Referral Orders         Ambulatory referral to Gastroenterology       Electronically signed by: Felix Pacini, DO [facility] [place]

## 2022-06-08 NOTE — Patient Instructions (Signed)
No follow-ups on file.        Great to see you today.  I have refilled the medication(s) we provide.   If labs were collected, we will inform you of lab results once received either by echart message or telephone call.   - echart message- for normal results that have been seen by the patient already.   - telephone call: abnormal results or if patient has not viewed results in their echart.  Health Maintenance, Female Adopting a healthy lifestyle and getting preventive care are important in promoting health and wellness. Ask your health care provider about: The right schedule for you to have regular tests and exams. Things you can do on your own to prevent diseases and keep yourself healthy. What should I know about diet, weight, and exercise? Eat a healthy diet  Eat a diet that includes plenty of vegetables, fruits, low-fat dairy products, and lean protein. Do not eat a lot of foods that are high in solid fats, added sugars, or sodium. Maintain a healthy weight Body mass index (BMI) is used to identify weight problems. It estimates body fat based on height and weight. Your health care provider can help determine your BMI and help you achieve or maintain a healthy weight. Get regular exercise Get regular exercise. This is one of the most important things you can do for your health. Most adults should: Exercise for at least 150 minutes each week. The exercise should increase your heart rate and make you sweat (moderate-intensity exercise). Do strengthening exercises at least twice a week. This is in addition to the moderate-intensity exercise. Spend less time sitting. Even light physical activity can be beneficial. Watch cholesterol and blood lipids Have your blood tested for lipids and cholesterol at 49 years of age, then have this test every 5 years. Have your cholesterol levels checked more often if: Your lipid or cholesterol levels are high. You are older than 49 years of  age. You are at high risk for heart disease. What should I know about cancer screening? Depending on your health history and family history, you may need to have cancer screening at various ages. This may include screening for: Breast cancer. Cervical cancer. Colorectal cancer. Skin cancer. Lung cancer. What should I know about heart disease, diabetes, and high blood pressure? Blood pressure and heart disease High blood pressure causes heart disease and increases the risk of stroke. This is more likely to develop in people who have high blood pressure readings or are overweight. Have your blood pressure checked: Every 3-5 years if you are 18-39 years of age. Every year if you are 40 years old or older. Diabetes Have regular diabetes screenings. This checks your fasting blood sugar level. Have the screening done: Once every three years after age 40 if you are at a normal weight and have a low risk for diabetes. More often and at a younger age if you are overweight or have a high risk for diabetes. What should I know about preventing infection? Hepatitis B If you have a higher risk for hepatitis B, you should be screened for this virus. Talk with your health care provider to find out if you are at risk for hepatitis B infection. Hepatitis C Testing is recommended for: Everyone born from 1945 through 1965. Anyone with known risk factors for hepatitis C. Sexually transmitted infections (STIs) Get screened for STIs, including gonorrhea and chlamydia, if: You are sexually active and are younger than 49 years of age. You are   older than 49 years of age and your health care provider tells you that you are at risk for this type of infection. Your sexual activity has changed since you were last screened, and you are at increased risk for chlamydia or gonorrhea. Ask your health care provider if you are at risk. Ask your health care provider about whether you are at high risk for HIV. Your health  care provider may recommend a prescription medicine to help prevent HIV infection. If you choose to take medicine to prevent HIV, you should first get tested for HIV. You should then be tested every 3 months for as long as you are taking the medicine. Pregnancy If you are about to stop having your period (premenopausal) and you may become pregnant, seek counseling before you get pregnant. Take 400 to 800 micrograms (mcg) of folic acid every day if you become pregnant. Ask for birth control (contraception) if you want to prevent pregnancy. Osteoporosis and menopause Osteoporosis is a disease in which the bones lose minerals and strength with aging. This can result in bone fractures. If you are 65 years old or older, or if you are at risk for osteoporosis and fractures, ask your health care provider if you should: Be screened for bone loss. Take a calcium or vitamin D supplement to lower your risk of fractures. Be given hormone replacement therapy (HRT) to treat symptoms of menopause. Follow these instructions at home: Alcohol use Do not drink alcohol if: Your health care provider tells you not to drink. You are pregnant, may be pregnant, or are planning to become pregnant. If you drink alcohol: Limit how much you have to: 0-1 drink a day. Know how much alcohol is in your drink. In the [place] one drink equals one 12 oz bottle of beer (355 mL), one 5 oz glass of wine (148 mL), or one 1 oz glass of hard liquor (44 mL). Lifestyle Do not use any products that contain nicotine or tobacco. These products include cigarettes, chewing tobacco, and vaping devices, such as e-cigarettes. If you need help quitting, ask your health care provider. Do not use street drugs. Do not share needles. Ask your health care provider for help if you need support or information about quitting drugs. General instructions Schedule regular health, dental, and eye exams. Stay current with your vaccines. Tell your health  care provider if: You often feel depressed. You have ever been abused or do not feel safe at home. Summary Adopting a healthy lifestyle and getting preventive care are important in promoting health and wellness. Follow your health care provider's instructions about healthy diet, exercising, and getting tested or screened for diseases. Follow your health care provider's instructions on monitoring your cholesterol and blood pressure. This information is not intended to replace advice given to you by your health care provider. Make sure you discuss any questions you have with your health care provider. Document Revised: 04/03/2021 Document Reviewed: 04/03/2021 Elsevier Patient Education  2023 Elsevier Inc.  

## 2022-06-09 LAB — CBC
HCT: 37.1 % (ref 35.0–45.0)
Hemoglobin: 12.8 g/dL (ref 11.7–15.5)
MCH: 32.7 pg (ref 27.0–33.0)
MCHC: 34.5 g/dL (ref 32.0–36.0)
MCV: 94.6 fL (ref 80.0–100.0)
MPV: 11.5 fL (ref 7.5–12.5)
Platelets: 278 10*3/uL (ref 140–400)
RBC: 3.92 10*6/uL (ref 3.80–5.10)
RDW: 11.8 % (ref 11.0–15.0)
WBC: 3.7 10*3/uL — ABNORMAL LOW (ref 3.8–10.8)

## 2022-06-09 LAB — COMPREHENSIVE METABOLIC PANEL
AG Ratio: 1.9 (calc) (ref 1.0–2.5)
ALT: 15 U/L (ref 6–29)
AST: 18 U/L (ref 10–35)
Albumin: 4.5 g/dL (ref 3.6–5.1)
Alkaline phosphatase (APISO): 58 U/L (ref 31–125)
BUN: 8 mg/dL (ref 7–25)
CO2: 29 mmol/L (ref 20–32)
Calcium: 9.1 mg/dL (ref 8.6–10.2)
Chloride: 101 mmol/L (ref 98–110)
Creat: 0.69 mg/dL (ref 0.50–0.99)
Globulin: 2.4 g/dL (calc) (ref 1.9–3.7)
Glucose, Bld: 79 mg/dL (ref 65–99)
Potassium: 4.1 mmol/L (ref 3.5–5.3)
Sodium: 138 mmol/L (ref 135–146)
Total Bilirubin: 0.5 mg/dL (ref 0.2–1.2)
Total Protein: 6.9 g/dL (ref 6.1–8.1)

## 2022-06-09 LAB — VITAMIN D 25 HYDROXY (VIT D DEFICIENCY, FRACTURES): Vit D, 25-Hydroxy: 22 ng/mL — ABNORMAL LOW (ref 30–100)

## 2022-06-09 LAB — LIPID PANEL
Cholesterol: 179 mg/dL (ref <200)
HDL: 92 mg/dL (ref >=50)
LDL Cholesterol (Calc): 74 mg/dL (calc)
Non-HDL Cholesterol (Calc): 87 mg/dL (calc) (ref <130)
Total CHOL/HDL Ratio: 1.9 (calc) (ref <5.0)
Triglycerides: 52 mg/dL (ref <150)

## 2022-06-09 LAB — HEMOGLOBIN A1C
Hgb A1c MFr Bld: 5 % of total Hgb (ref <5.7)
Mean Plasma Glucose: 97 mg/dL
eAG (mmol/L): 5.4 mmol/L

## 2022-06-09 LAB — VITAMIN B12: Vitamin B-12: 1301 pg/mL — ABNORMAL HIGH (ref 200–1100)

## 2022-06-09 LAB — TSH: TSH: 1.25 mIU/L

## 2022-06-11 ENCOUNTER — Telehealth: Payer: Self-pay

## 2022-06-11 NOTE — Telephone Encounter (Signed)
Spoke with pt regarding labs and instructions.   

## 2022-06-11 NOTE — Telephone Encounter (Signed)
Patient returning Toria's call regarding results.  Please call back when available

## 2022-10-16 ENCOUNTER — Other Ambulatory Visit: Payer: Self-pay | Admitting: Obstetrics and Gynecology

## 2022-10-16 DIAGNOSIS — E049 Nontoxic goiter, unspecified: Secondary | ICD-10-CM

## 2022-11-06 ENCOUNTER — Ambulatory Visit
Admission: RE | Admit: 2022-11-06 | Discharge: 2022-11-06 | Disposition: A | Discharge status: Home / Self Care | Payer: No Typology Code available for payment source | Source: Ambulatory Visit | Attending: Obstetrics and Gynecology | Admitting: Obstetrics and Gynecology

## 2022-11-06 DIAGNOSIS — E049 Nontoxic goiter, unspecified: Secondary | ICD-10-CM

## 2022-11-29 ENCOUNTER — Encounter: Payer: Self-pay | Admitting: Family Medicine

## 2022-11-29 ENCOUNTER — Ambulatory Visit (INDEPENDENT_AMBULATORY_CARE_PROVIDER_SITE_OTHER): Payer: 59 | Admitting: Family Medicine

## 2022-11-29 VITALS — BP 126/84 | HR 78 | Temp 98.1°F | Wt 159.2 lb

## 2022-11-29 DIAGNOSIS — L309 Dermatitis, unspecified: Secondary | ICD-10-CM

## 2022-11-29 DIAGNOSIS — F418 Other specified anxiety disorders: Secondary | ICD-10-CM | POA: Diagnosis not present

## 2022-11-29 MED ORDER — TRIAMCINOLONE ACETONIDE 0.1 % EX CREA: 1.0000 | TOPICAL_CREAM | Freq: Two times a day (BID) | CUTANEOUS | 5 refills | Status: DC

## 2022-11-29 MED ORDER — VENLAFAXINE HCL ER 37.5 MG PO CP24: 37.5000 mg | ORAL_CAPSULE | Freq: Every day | ORAL | 1 refills | Status: DC

## 2022-11-29 NOTE — Patient Instructions (Signed)
Return in about 6 months (around 06/10/2023) for cpe (20 min), Routine chronic condition follow-up.        Great to see you today.  I have refilled the medication(s) we provide.   If labs were collected, we will inform you of lab results once received either by echart message or telephone call.   - echart message- for normal results that have been seen by the patient already.   - telephone call: abnormal results or if patient has not viewed results in their echart.  

## 2022-11-29 NOTE — Progress Notes (Signed)
Patient ID: Anysa Tacey, female  DOB: [birth year], 52 y.o.   MRN: [id number] Patient Care Team    Relationship Specialty Notifications Start End  Ma Hillock, DO PCP - General Family Medicine  07/25/16   Juanita Craver, MD Consulting Physician Gastroenterology  11/29/22     Chief Complaint  Patient presents with   Depression    Subjective: Amaree Loisel is a 52 y.o.  Female  present for chronic conditions management All past medical history, surgical history, allergies, family history, immunizations, medications and social history were updated in the electronic medical record today. All recent labs, ED visits and hospitalizations within the last year were reviewed.  Depression/anxiety: Pt reports compliance with effexor 75 mg qd. She denies side effects.  She states she is feeling really good at the moment and is wondering if it is time to consider tapering down on the medication.      06/08/2022    3:23 PM 03/29/2021    2:51 PM 09/30/2020    8:44 AM 08/19/2020    2:47 PM 07/25/2016    9:24 AM  Depression screen PHQ 2/9  Decreased Interest 1 0 1 2 0  Down, Depressed, Hopeless 1 0 0 3 0  PHQ - 2 Score 2 0 1 5 0  Altered sleeping 2  1 3    Tired, decreased energy 2  2 3    Change in appetite 0  1 3   Feeling bad or failure about yourself  1  0 3   Trouble concentrating 2  1 3    Moving slowly or fidgety/restless 0  0 1   Suicidal thoughts 0  0 1   PHQ-9 Score 9  6 22        06/08/2022    3:26 PM 09/30/2020    8:45 AM 08/19/2020    2:47 PM  GAD 7 : Generalized Anxiety Score  Nervous, Anxious, on Edge 2 1 3   Control/stop worrying 0 0 2  Worry too much - different things 0 0 3  Trouble relaxing 0 0 2  Restless 0 0 1  Easily annoyed or irritable 1 1 3   Afraid - awful might happen 0 0 2  Total GAD 7 Score 3 2 16     Immunization History  Administered Date(s) Administered   PFIZER(Purple Top)SARS-COV-2 Vaccination 02/24/2020, 03/15/2020, 11/16/2020   Tdap 11/26/2012    Past  Medical History:  Diagnosis Date   Allergic rhinitis    Allergy    Breast mass    Chicken pox    Eczema    FH: deafness or hearing loss 08/03/2016   Headache    Hospitalize, right parotid questionable enlargement, MRI/CT and ultrasound completed was believed to be normal.   Major depression    Malaise    Migraines    Sleep disturbance 09/30/2020   Vitamin D deficiency    Allergies  Allergen Reactions   Benadryl [Diphenhydramine] Anxiety    Paradoxical reaction   Past Surgical History:  Procedure Laterality Date   WISDOM TOOTH EXTRACTION     Family History  Problem Relation Age of Onset   Cancer Mother        adenoid  cystic carcinoma    Osteoporosis Mother    Osteosclerosis Mother        hearing loss in early 56s   Alcohol abuse Father    Arthritis Father    Cancer Father    Diabetes Father    Hearing loss Father    Heart disease  Father    Alcohol abuse Maternal Aunt    Alcohol abuse Maternal Uncle    Alcohol abuse Paternal Uncle    Social History   Social History Narrative   Partner [place], 2 children [place] and [place]   BA. Marketing.    Caffeine use. Herbal use. Daily vitamin.    Wears her seatbelt, bicycle helmet, smoke detector in the home.    Exercises routinely.    Feels safe in her relationships.     Allergies as of 11/29/2022       Reactions   Benadryl [diphenhydramine] Anxiety   Paradoxical reaction        Medication List        Accurate as of November 29, 2022  9:20 AM. If you have any questions, ask your nurse or doctor.          cetirizine 10 MG tablet Commonly known as: ZYRTEC Take 10 mg by mouth daily as needed.   EPINEPHrine 0.3 mg/0.3 mL Soaj injection Commonly known as: EPI-PEN U UTD   loratadine 10 MG tablet Commonly known as: CLARITIN Take 10 mg by mouth daily as needed.   montelukast 10 MG tablet Commonly known as: SINGULAIR TK 1 T PO QD   mupirocin ointment 2 % Commonly known as: BACTROBAN Apply to affected  area 1-2 x daily   triamcinolone cream 0.1 % Commonly known as: KENALOG Apply 1 application topically 2 (two) times daily.   venlafaxine XR 75 MG 24 hr capsule Commonly known as: Effexor XR Take 1 capsule (75 mg total) by mouth daily with breakfast.   Vitamin D3 125 MCG (5000 UT) Caps Take by mouth.        All past medical history, surgical history, allergies, family history, immunizations andmedications were updated in the EMR today and reviewed under the history and medication portions of their EMR.     No results found for this or any previous visit (from the past 2160 hour(s)).   ROS 14 pt review of systems performed and negative (unless mentioned in an HPI)  Objective: BP 126/84   Pulse 78   Temp 98.1 F (36.7 C)   Wt 159 lb 3.2 oz (72.2 kg)   LMP 11/08/2022 (Approximate)   SpO2 99%   BMI 25.70 kg/m  Physical Exam Vitals and nursing note reviewed.  Constitutional:      General: She is not in acute distress.    Appearance: Normal appearance. She is normal weight. She is not ill-appearing or toxic-appearing.  Eyes:     Extraocular Movements: Extraocular movements intact.     Conjunctiva/sclera: Conjunctivae normal.     Pupils: Pupils are equal, round, and reactive to light.  Neurological:     Mental Status: She is alert and oriented to person, place, and time. Mental status is at baseline.  Psychiatric:        Mood and Affect: Mood normal.        Behavior: Behavior normal.        Thought Content: Thought content normal.        Judgment: Judgment normal.      No results found.  Assessment/plan: Keyla Milone is a 52 y.o. female present for chronic conditions management Depression with anxiety Stable Decrease Effexor to 37.5 mg daily for 2 months.  If she desires to attempt to taper off after 2 months she was given the instructions to continue 37.5 mg every other day for 2 weeks-then she can stop. If she decides to return to  the 75 mg tab, I have  instructed her to call in and we will change the Effexor back to 75 mg daily. F/u 5.5 mos.  Return in about 6 months (around 06/10/2023) for cpe (20 min), Routine chronic condition follow-up.    No orders of the defined types were placed in this encounter.  No orders of the defined types were placed in this encounter.  Referral Orders  No referral(s) requested today      Electronically signed by: Felix Pacini, DO [facility] [place]

## 2022-12-05 ENCOUNTER — Telehealth: Payer: Self-pay

## 2022-12-05 DIAGNOSIS — L309 Dermatitis, unspecified: Secondary | ICD-10-CM

## 2022-12-05 MED ORDER — VENLAFAXINE HCL ER 37.5 MG PO CP24: 37.5000 mg | ORAL_CAPSULE | Freq: Every day | ORAL | 1 refills | Status: AC

## 2022-12-05 MED ORDER — TRIAMCINOLONE ACETONIDE 0.1 % EX CREA: 1.0000 | TOPICAL_CREAM | Freq: Two times a day (BID) | CUTANEOUS | 5 refills | Status: AC

## 2022-12-05 NOTE — Telephone Encounter (Signed)
Rx sent 

## 2022-12-05 NOTE — Telephone Encounter (Signed)
Patient was seen last week on 1/4 by Dr. Raoul Pitch.  Pharmacy is telling patient they have not received any prescriptions from our office for said patient.  Can we please resend to [facility]?  triamcinolone cream (KENALOG) 0.1 %   venlafaxine XR (EFFEXOR XR) 37.5 MG 24 hr capsule

## 2023-02-23 IMAGING — MG DIGITAL DIAGNOSTIC BILAT W/ TOMO W/ CAD
8 of 12 series · 8 of 28 positions shown · non-contrast
Comparison: Previous exam(s).

CLINICAL DATA: 51-year-old female for delayed 1 year follow-up of
LEFT breast calcifications (now 5 years) and for annual bilateral
mammogram.

EXAM:
DIGITAL DIAGNOSTIC BILATERAL MAMMOGRAM WITH TOMOSYNTHESIS AND CAD
TECHNIQUE: Bilateral digital diagnostic mammography and breast tomosynthesis
was performed. The images were evaluated with computer-aided
detection.

[L ML (1 of 3)]
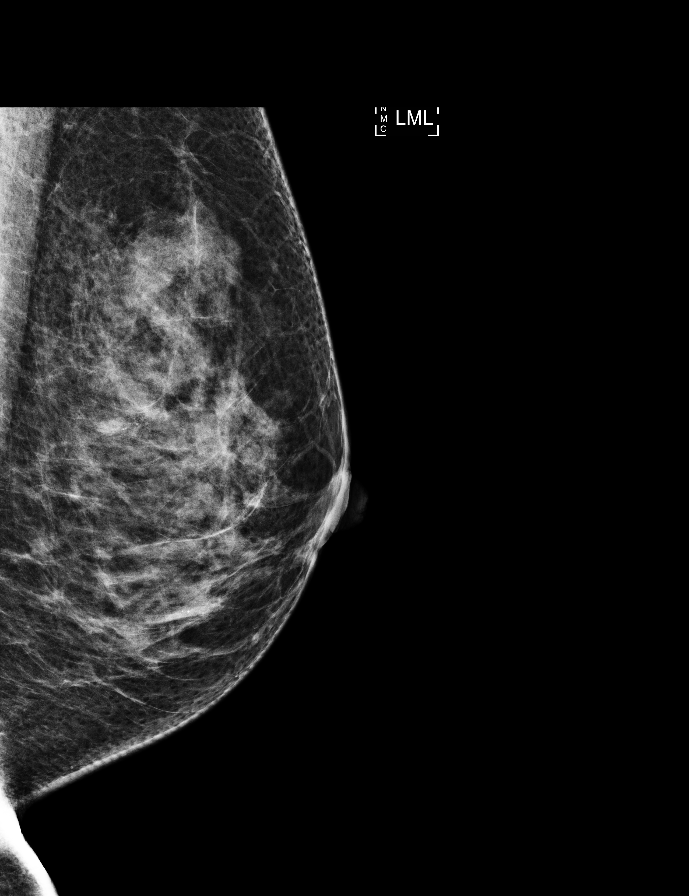

[L ML (2 of 3)]
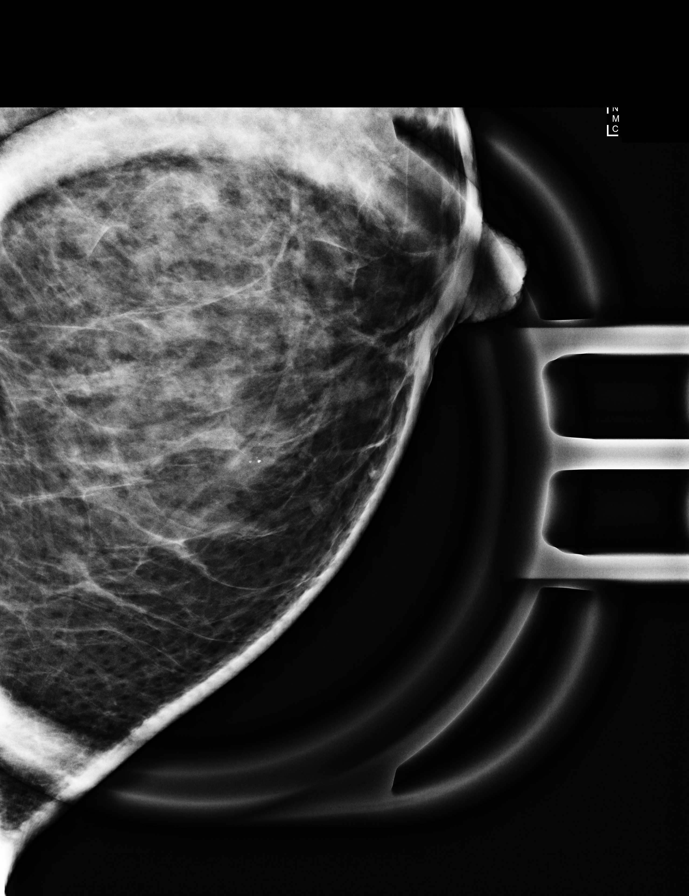

[L ML (3 of 3)]
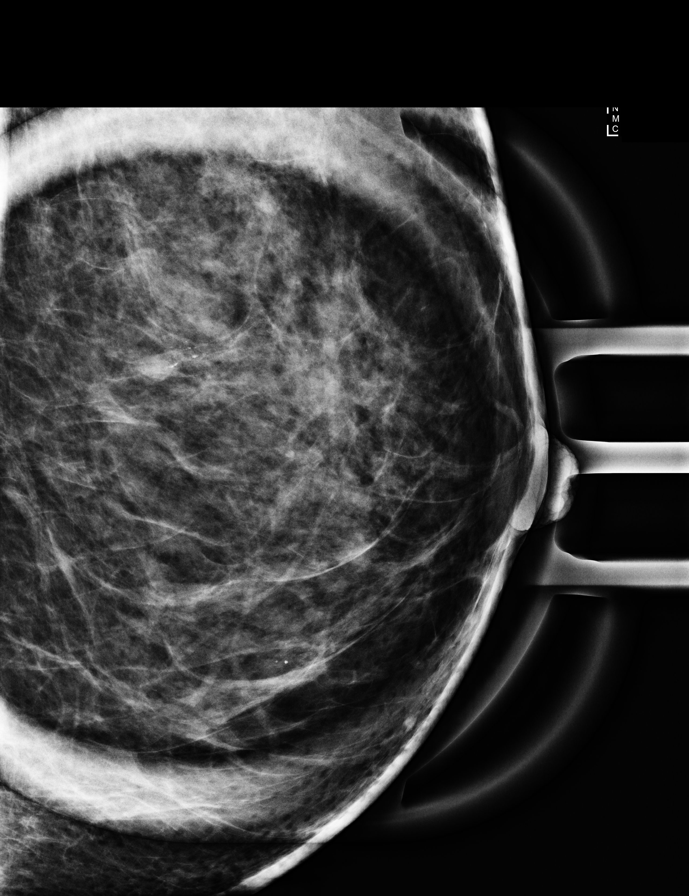

[L CC]
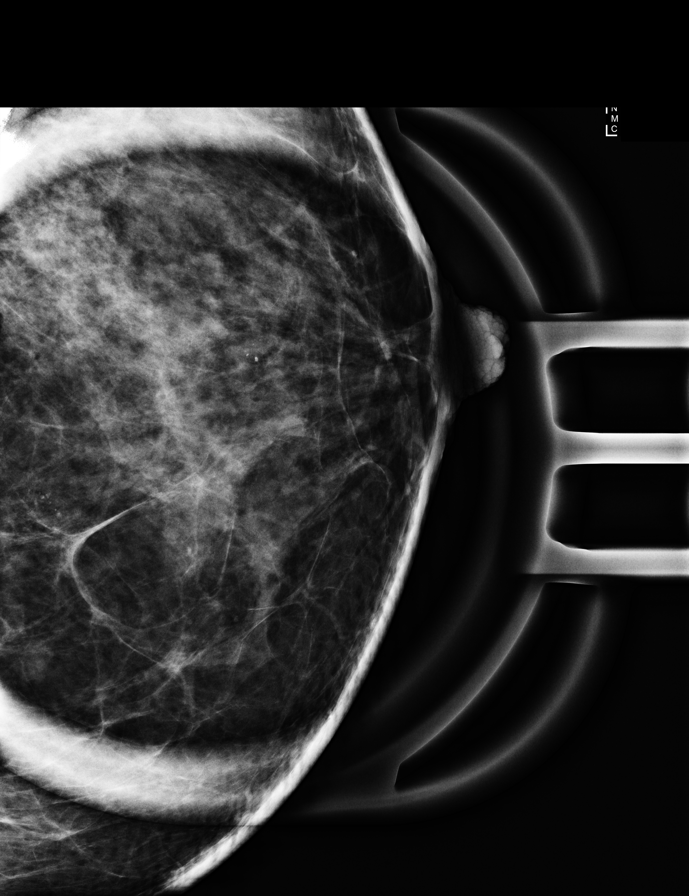

[L CC synth-2D]
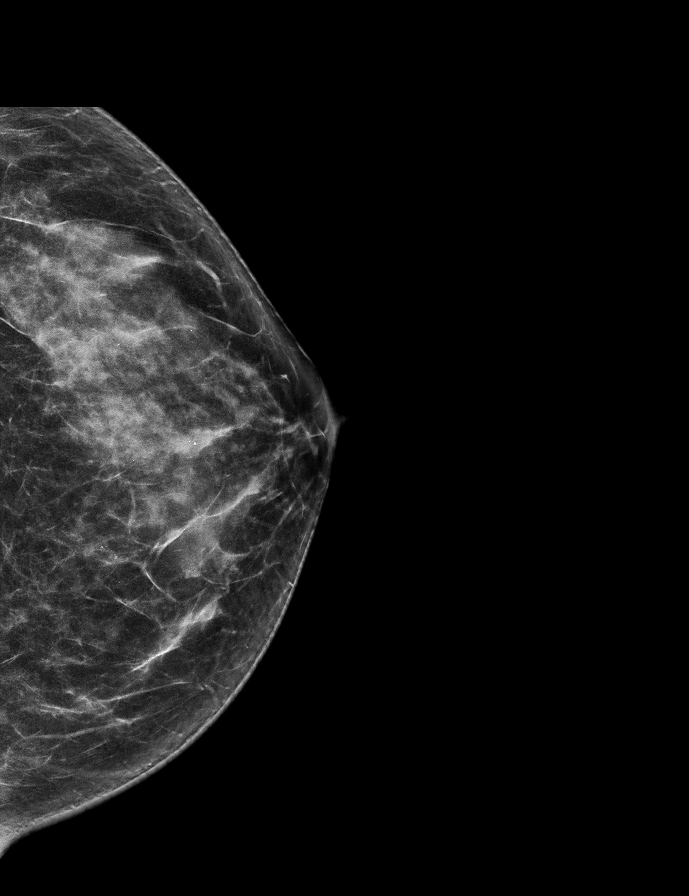

[L MLO synth-2D]
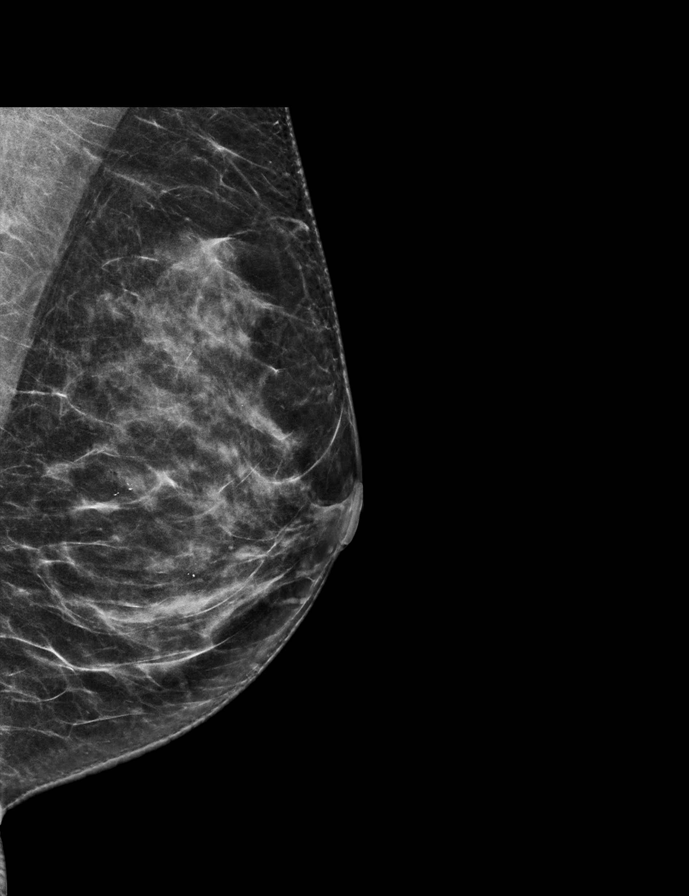

[R MLO synth-2D]
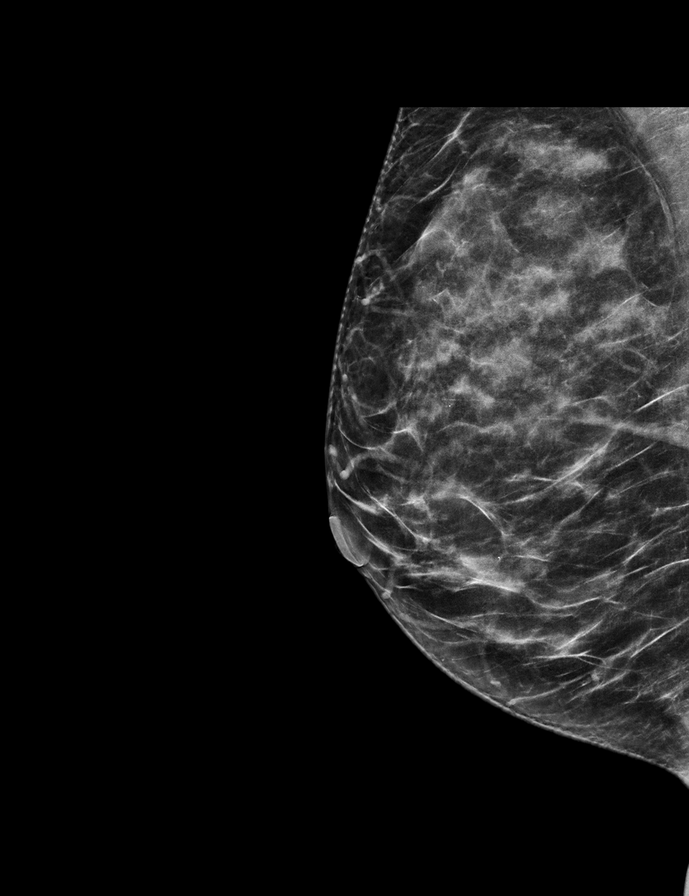

[R CC synth-2D]
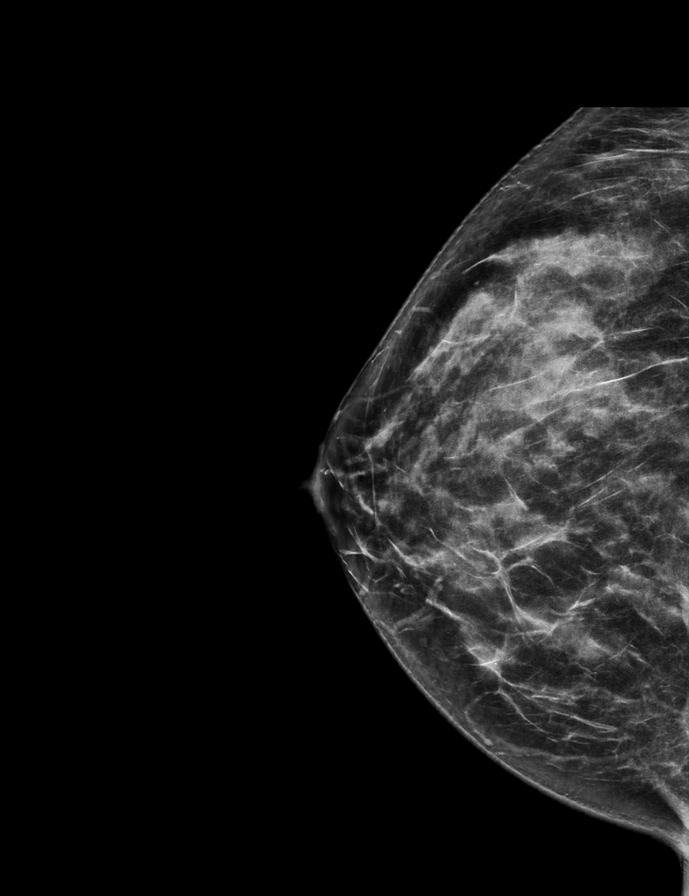

[8 of 28 positions shown; findings below may reference images not displayed]

ACR Breast Density Category c: The breast tissue is heterogeneously
dense, which may obscure small masses.
FINDINGS: Full field views of both breast and magnification views of the LEFT
breast demonstrate an unchanged group of round calcifications within
the anterior LOWER OUTER LEFT breast.

A new 0.6 cm group of UPPER INNER LEFT breast calcifications, middle
depth, layer on the LATERAL view and are compatible with benign milk
of calcium.

No other new or suspicious mammographic abnormalities are noted
within either breast.
IMPRESSION: 1. Stable LOWER OUTER LEFT breast calcifications, compatible with a
benign process.
2. Benign milk of calcium within the UPPER INNER LEFT breast.
3. No suspicious mammographic findings within either breast.

RECOMMENDATION:
Bilateral screening mammogram in 1 year.

I have discussed the findings and recommendations with the patient.
If applicable, a reminder letter will be sent to the patient
regarding the next appointment.

BI-RADS CATEGORY  2: Benign.

## 2023-07-31 ENCOUNTER — Telehealth: Payer: Self-pay | Admitting: Family Medicine

## 2023-07-31 NOTE — Telephone Encounter (Signed)
Noted  

## 2023-07-31 NOTE — Telephone Encounter (Signed)
Patient called to inquire about needing to be seen due to being stung by a bee 2 days ago. She states that her hand continues to swell. I advised her to go to urgent care, since our office does not have any openings. All other offices last appointments were at 4 and patient called at 4 pm. Patient declined triage after process was explained, stated she would go to urgent care.

## 2024-04-07 ENCOUNTER — Other Ambulatory Visit: Payer: Self-pay | Admitting: Family Medicine

## 2024-04-07 DIAGNOSIS — Z1231 Encounter for screening mammogram for malignant neoplasm of breast: Secondary | ICD-10-CM

## 2024-04-14 ENCOUNTER — Ambulatory Visit
Admission: RE | Admit: 2024-04-14 | Discharge: 2024-04-14 | Disposition: A | Discharge status: Home / Self Care | Source: Ambulatory Visit | Attending: Family Medicine | Admitting: Family Medicine

## 2024-04-14 DIAGNOSIS — Z1231 Encounter for screening mammogram for malignant neoplasm of breast: Secondary | ICD-10-CM

## 2024-10-19 ENCOUNTER — Emergency Department (HOSPITAL_COMMUNITY): Admitting: Anesthesiology

## 2024-10-19 ENCOUNTER — Other Ambulatory Visit: Payer: Self-pay

## 2024-10-19 ENCOUNTER — Ambulatory Visit (HOSPITAL_COMMUNITY)
Admission: EM | Admit: 2024-10-19 | Discharge: 2024-10-19 | Disposition: A | Discharge status: Home / Self Care | Attending: Gastroenterology | Admitting: Gastroenterology

## 2024-10-19 ENCOUNTER — Encounter (HOSPITAL_COMMUNITY): Payer: Self-pay | Admitting: *Deleted

## 2024-10-19 ENCOUNTER — Encounter (HOSPITAL_COMMUNITY)
Admission: EM | Disposition: A | Discharge status: Home / Self Care | Payer: Self-pay | Source: Home / Self Care | Attending: Emergency Medicine

## 2024-10-19 DIAGNOSIS — K2101 Gastro-esophageal reflux disease with esophagitis, with bleeding: Secondary | ICD-10-CM | POA: Diagnosis not present

## 2024-10-19 DIAGNOSIS — W44F3XA Food entering into or through a natural orifice, initial encounter: Secondary | ICD-10-CM | POA: Diagnosis not present

## 2024-10-19 DIAGNOSIS — Z79899 Other long term (current) drug therapy: Secondary | ICD-10-CM | POA: Diagnosis not present

## 2024-10-19 DIAGNOSIS — T18128A Food in esophagus causing other injury, initial encounter: Secondary | ICD-10-CM | POA: Diagnosis not present

## 2024-10-19 DIAGNOSIS — F419 Anxiety disorder, unspecified: Secondary | ICD-10-CM | POA: Diagnosis not present

## 2024-10-19 DIAGNOSIS — K200 Eosinophilic esophagitis: Secondary | ICD-10-CM | POA: Diagnosis not present

## 2024-10-19 DIAGNOSIS — R519 Headache, unspecified: Secondary | ICD-10-CM | POA: Diagnosis not present

## 2024-10-19 DIAGNOSIS — R1310 Dysphagia, unspecified: Secondary | ICD-10-CM | POA: Diagnosis present

## 2024-10-19 DIAGNOSIS — T18108A Unspecified foreign body in esophagus causing other injury, initial encounter: Secondary | ICD-10-CM | POA: Diagnosis not present

## 2024-10-19 DIAGNOSIS — F32A Depression, unspecified: Secondary | ICD-10-CM | POA: Diagnosis not present

## 2024-10-19 HISTORY — DX: Family history of other specified conditions: Z84.89

## 2024-10-19 HISTORY — PX: ESOPHAGOGASTRODUODENOSCOPY: SHX5428

## 2024-10-19 LAB — CBC WITH DIFFERENTIAL/PLATELET
Abs Immature Granulocytes: 0.01 K/uL (ref 0.00–0.07)
Basophils Absolute: 0 K/uL (ref 0.0–0.1)
Basophils Relative: 1 %
Eosinophils Absolute: 0.5 K/uL (ref 0.0–0.5)
Eosinophils Relative: 9 %
HCT: 38.9 % (ref 36.0–46.0)
Hemoglobin: 13.3 g/dL (ref 12.0–15.0)
Immature Granulocytes: 0 %
Lymphocytes Relative: 34 %
Lymphs Abs: 1.7 K/uL (ref 0.7–4.0)
MCH: 31.9 pg (ref 26.0–34.0)
MCHC: 34.2 g/dL (ref 30.0–36.0)
MCV: 93.3 fL (ref 80.0–100.0)
Monocytes Absolute: 0.6 K/uL (ref 0.1–1.0)
Monocytes Relative: 12 %
Neutro Abs: 2.2 K/uL (ref 1.7–7.7)
Neutrophils Relative %: 44 %
Platelets: 264 K/uL (ref 150–400)
RBC: 4.17 MIL/uL (ref 3.87–5.11)
RDW: 12.2 % (ref 11.5–15.5)
WBC: 5 K/uL (ref 4.0–10.5)
nRBC: 0 % (ref 0.0–0.2)

## 2024-10-19 LAB — BASIC METABOLIC PANEL WITH GFR
Anion gap: 12 (ref 5–15)
BUN: 9 mg/dL (ref 6–20)
CO2: 23 mmol/L (ref 22–32)
Calcium: 8.5 mg/dL — ABNORMAL LOW (ref 8.9–10.3)
Chloride: 103 mmol/L (ref 98–111)
Creatinine, Ser: 0.74 mg/dL (ref 0.44–1.00)
GFR, Estimated: >60 mL/min (ref >60)
Glucose, Bld: 96 mg/dL (ref 70–99)
Potassium: 3.4 mmol/L — ABNORMAL LOW (ref 3.5–5.1)
Sodium: 138 mmol/L (ref 135–145)

## 2024-10-19 SURGERY — EGD (ESOPHAGOGASTRODUODENOSCOPY)
Anesthesia: General

## 2024-10-19 MED ORDER — OXYCODONE HCL 5 MG PO TABS: 5.0000 mg | ORAL_TABLET | Freq: Once | ORAL | Status: DC | PRN

## 2024-10-19 MED ORDER — ONDANSETRON HCL 4 MG/2ML IJ SOLN
4.0000 mg | Freq: Once | INTRAMUSCULAR | Status: AC
  Administered 2024-10-19: 4 mg via INTRAVENOUS
  Filled 2024-10-19: qty 2

## 2024-10-19 MED ORDER — FENTANYL CITRATE (PF) 100 MCG/2ML IJ SOLN: 25.0000 ug | INTRAMUSCULAR | Status: DC | PRN

## 2024-10-19 MED ORDER — ONDANSETRON HCL 4 MG/2ML IJ SOLN
INTRAMUSCULAR | Status: DC | PRN
  Administered 2024-10-19: 4 mg via INTRAVENOUS

## 2024-10-19 MED ORDER — MIDAZOLAM HCL 2 MG/2ML IJ SOLN
INTRAMUSCULAR | Status: AC
  Filled 2024-10-19: qty 2

## 2024-10-19 MED ORDER — SUCCINYLCHOLINE CHLORIDE 200 MG/10ML IV SOSY
PREFILLED_SYRINGE | INTRAVENOUS | Status: DC | PRN
  Administered 2024-10-19: 120 mg via INTRAVENOUS

## 2024-10-19 MED ORDER — FENTANYL CITRATE (PF) 100 MCG/2ML IJ SOLN
INTRAMUSCULAR | Status: DC | PRN
Start: 2024-10-19 — End: 2024-10-19
  Administered 2024-10-19 (×2): 50 ug via INTRAVENOUS

## 2024-10-19 MED ORDER — DEXAMETHASONE SOD PHOSPHATE PF 10 MG/ML IJ SOLN
INTRAMUSCULAR | Status: DC | PRN
  Administered 2024-10-19: 10 mg via INTRAVENOUS

## 2024-10-19 MED ORDER — SODIUM CHLORIDE 0.9 % IV SOLN: INTRAVENOUS | Status: DC

## 2024-10-19 MED ORDER — PROPOFOL 10 MG/ML IV BOLUS
INTRAVENOUS | Status: DC | PRN
  Administered 2024-10-19: 200 mg via INTRAVENOUS

## 2024-10-19 MED ORDER — GLUCAGON HCL RDNA (DIAGNOSTIC) 1 MG IJ SOLR
1.0000 mg | Freq: Once | INTRAMUSCULAR | Status: AC
  Administered 2024-10-19: 1 mg via INTRAVENOUS
  Filled 2024-10-19: qty 1

## 2024-10-19 MED ORDER — SODIUM CHLORIDE 0.9 % IV BOLUS
500.0000 mL | Freq: Once | INTRAVENOUS | Status: AC
  Administered 2024-10-19: 500 mL via INTRAVENOUS

## 2024-10-19 MED ORDER — ONDANSETRON HCL 4 MG/2ML IJ SOLN
4.0000 mg | Freq: Once | INTRAMUSCULAR | Status: AC | PRN
Start: 2024-10-19 — End: 2024-10-19
  Administered 2024-10-19: 4 mg via INTRAVENOUS
  Filled 2024-10-19: qty 2

## 2024-10-19 MED ORDER — ONDANSETRON HCL 4 MG/2ML IJ SOLN: 4.0000 mg | Freq: Four times a day (QID) | INTRAMUSCULAR | Status: DC | PRN

## 2024-10-19 MED ORDER — SODIUM CHLORIDE 0.9 % IV SOLN: INTRAVENOUS | Status: DC | PRN

## 2024-10-19 MED ORDER — MORPHINE SULFATE (PF) 4 MG/ML IV SOLN
4.0000 mg | Freq: Once | INTRAVENOUS | Status: AC
  Administered 2024-10-19: 4 mg via INTRAVENOUS
  Filled 2024-10-19: qty 1

## 2024-10-19 MED ORDER — FENTANYL CITRATE (PF) 100 MCG/2ML IJ SOLN
INTRAMUSCULAR | Status: AC
  Filled 2024-10-19: qty 2

## 2024-10-19 MED ORDER — LIDOCAINE 2% (20 MG/ML) 5 ML SYRINGE
INTRAMUSCULAR | Status: DC | PRN
  Administered 2024-10-19: 100 mg via INTRAVENOUS

## 2024-10-19 MED ORDER — OXYCODONE HCL 5 MG/5ML PO SOLN: 5.0000 mg | Freq: Once | ORAL | Status: DC | PRN

## 2024-10-19 NOTE — Anesthesia Procedure Notes (Signed)
 Procedure Name: Intubation Date/Time: 10/19/2024 12:45 PM  Performed by: Obadiah Reyes BROCKS, CRNAPre-anesthesia Checklist: Patient identified, Emergency Drugs available, Suction available, Patient being monitored and Timeout performed Patient Re-evaluated:Patient Re-evaluated prior to induction Oxygen Delivery Method: Circle System Utilized Preoxygenation: Pre-oxygenation with 100% oxygen Induction Type: IV induction and Rapid sequence Ventilation: Mask ventilation without difficulty Laryngoscope Size: Miller and 2 Grade View: Grade I Tube type: Oral Number of attempts: 1 Airway Equipment and Method: Stylet and Oral airway Placement Confirmation: ETT inserted through vocal cords under direct vision, positive ETCO2 and breath sounds checked- equal and bilateral Secured at: 20 cm Tube secured with: Tape Dental Injury: Teeth and Oropharynx as per pre-operative assessment

## 2024-10-19 NOTE — Discharge Instructions (Addendum)
 Go to [facility] ([address], [place], [place] [address]) to pick up Voquenza samples. Dr Nola office will call you to schedule a follow-up in her office within a week.  YOU HAD AN ENDOSCOPIC PROCEDURE TODAY: Refer to the procedure report and other information in the discharge instructions given to you for any specific questions about what was found during the examination. If this information does not answer your questions, please call [facility] at [phone] to clarify.   YOU SHOULD EXPECT: Some feelings of bloating in the abdomen. Passage of more gas than usual. Walking can help get rid of the air that was put into your GI tract during the procedure and reduce the bloating.  DIET: Your first meal following the procedure should be a light meal and then it is ok to progress to your normal diet. A half-sandwich or bowl of soup is an example of a good first meal. Heavy or fried foods are harder to digest and may make you feel nauseous or bloated. Drink plenty of fluids but you should avoid alcoholic beverages for 24 hours.  ACTIVITY: Your care partner should take you home directly after the procedure. You should plan to take it easy, moving slowly for the rest of the day. You can resume normal activity the day after the procedure however YOU SHOULD NOT DRIVE, use power tools, machinery or perform tasks that involve climbing or major physical exertion for 24 hours (because of the sedation medicines used during the test).   SYMPTOMS TO REPORT IMMEDIATELY: A gastroenterologist can be reached at any hour. Please call [phone]  for any of the following symptoms:   Following upper endoscopy (EGD, EUS, ERCP, esophageal dilation) Vomiting of blood or coffee ground material  New, significant abdominal pain  New, significant chest pain or pain under the shoulder blades  Painful or persistently difficult swallowing  New shortness of breath  Black, tarry-looking or red, bloody  stools  FOLLOW UP:  If any biopsies were taken you will be contacted by phone or by letter within the next 1-3 weeks. Call [phone]  if you have not heard about the biopsies in 3 weeks.  Please also call with any specific questions about appointments or follow up tests.

## 2024-10-19 NOTE — ED Triage Notes (Signed)
 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

## 2024-10-19 NOTE — ED Provider Notes (Signed)
 Emergency Department Provider Note   I have reviewed the triage vital signs and the nursing notes.   HISTORY  Chief Complaint Foreign Body   HPI Darlene Benton is a 53 y.o. female with past reviewed reviewed presents to the emergency department with sensation of food stuck in her throat.  She was eating pork roast for dinner when she swallowed and felt like food got stuck in her mid chest.  She has since been unable to eat or drink anything else.  She notes that saliva comes back up as well.  No shortness of breath.  No difficulty speaking.  She has had food temporarily get stuck in the past but was always able to get it either come back up or go down.  She is followed by Dr. Kristie with gastroenterology and called the on-call provider who advised she present to the emergency department.    Past Medical History:  Diagnosis Date   Allergic rhinitis    Allergy    Breast mass    Chicken pox    Eczema    FH: deafness or hearing loss 08/03/2016   Headache    Hospitalize, right parotid questionable enlargement, MRI/CT and ultrasound completed was believed to be normal.   Major depression    Malaise    Migraines    Sleep disturbance 09/30/2020   Vitamin D  deficiency     Review of Systems  Constitutional: No fever/chills ENT: No sore throat.  Cardiovascular: Denies chest pain. Respiratory: Denies shortness of breath. Gastrointestinal: No abdominal pain. Positive nausea, no vomiting.  No diarrhea.  No constipation. Neurological: Negative for headaches.  ____________________________________________   PHYSICAL EXAM:  VITAL SIGNS: ED Triage Vitals  Encounter Vitals Group     BP 10/19/24 0458 124/69     Pulse Rate 10/19/24 0458 88     Resp 10/19/24 0458 19     Temp 10/19/24 0458 98.4 F (36.9 C)     Temp Source 10/19/24 0458 Oral     SpO2 10/19/24 0458 100 %     Weight 10/19/24 0504 155 lb (70.3 kg)     Height 10/19/24 0504 5' 6 (1.676 m)   Constitutional: Alert and  oriented. Well appearing and in no acute distress. Eyes: Conjunctivae are normal.  Head: Atraumatic. Nose: No congestion/rhinnorhea. Mouth/Throat: Mucous membranes are moist.  Neck: No stridor.  Cardiovascular: Normal rate, regular rhythm. Good peripheral circulation. Grossly normal heart sounds.   Respiratory: Normal respiratory effort.  No retractions. Lungs CTAB. Gastrointestinal: Soft and nontender. No distention.  Musculoskeletal:  No gross deformities of extremities. Neurologic:  Normal speech and language.  Skin:  Skin is warm, dry and intact. No rash noted.  ____________________________________________   LABS (all labs ordered are listed, but only abnormal results are displayed)  Labs Reviewed  BASIC METABOLIC PANEL WITH GFR - Abnormal; Notable for the following components:      Result Value   Potassium 3.4 (*)    Calcium 8.5 (*)    All other components within normal limits  CBC WITH DIFFERENTIAL/PLATELET    ____________________________________________   PROCEDURES  Procedure(s) performed:   Procedures  None  ____________________________________________   INITIAL IMPRESSION / ASSESSMENT AND PLAN / ED COURSE  Pertinent labs & imaging results that were available during my care of the patient were reviewed by me and considered in my medical decision making (see chart for details).   This patient is Presenting for Evaluation of nausea/esophageal food impaction, which does require a range of treatment options,  and is a complaint that involves a high risk of morbidity and mortality.  The Differential Diagnoses include esophageal food impaction, gastritis, esophagitis, etc.  Critical Interventions-    Medications  sodium chloride  0.9 % bolus 500 mL (0 mLs Intravenous Stopped 10/19/24 0648)  glucagon  (human recombinant) (GLUCAGEN ) injection 1 mg (1 mg Intravenous Given 10/19/24 0541)  ondansetron  (ZOFRAN ) injection 4 mg (4 mg Intravenous Given 10/19/24 0537)     Reassessment after intervention:  no real change in symptoms.    Clinical Laboratory Tests Ordered, included CBC without leukocytosis or anemia. No AKI.   Cardiac Monitor Tracing which shows NSR.    Social Determinants of Health Risk patient is a non-smoker.   Consult complete with Dr. Charlanne with [facility]. They coordinated with Dr. Rollin who can take to endoscopy later this AM.   Medical Decision Making: Summary:  Patient presents to the emergency department with sensation of food stuck in her mid chest after swallowing.  I do suspect an esophageal food impaction clinically.  She is followed by Dr. Kristie.  Will try glucagon  first and reassess.  Reevaluation with update and discussion with patient. Plan for endoscopy this AM.   Patient's presentation is most consistent with acute presentation with potential threat to life or bodily function.   Disposition: Endoscopy   ____________________________________________  FINAL CLINICAL IMPRESSION(S) / ED DIAGNOSES  Final diagnoses:  Food impaction of esophagus, initial encounter   Note:  This document was prepared using Dragon voice recognition software and may include unintentional dictation errors.  Fonda Law, MD, [facility]    Olson Lucarelli, Fonda MATSU, MD 10/19/24 [phone]

## 2024-10-19 NOTE — Anesthesia Postprocedure Evaluation (Signed)
 Anesthesia Post Note  Patient: Darlene Benton  Procedure(s) Performed: EGD (ESOPHAGOGASTRODUODENOSCOPY)     Patient location during evaluation: Endoscopy Anesthesia Type: General Level of consciousness: awake and alert Pain management: pain level controlled Vital Signs Assessment: post-procedure vital signs reviewed and stable Respiratory status: spontaneous breathing, nonlabored ventilation and respiratory function stable Cardiovascular status: blood pressure returned to baseline and stable Postop Assessment: no apparent nausea or vomiting Anesthetic complications: no   No notable events documented.  Last Vitals:  Vitals:   10/19/24 1310 10/19/24 1320  BP: (!) 129/53 122/70  Pulse: (!) 105 96  Resp: 14 13  Temp:    SpO2: 97% 100%    Last Pain:  Vitals:   10/19/24 1320  TempSrc:   PainSc: 5                  Annlouise Gerety,W. EDMOND

## 2024-10-19 NOTE — Transfer of Care (Signed)
 Immediate Anesthesia Transfer of Care Note  Patient: Darlene Benton  Procedure(s) Performed: EGD (ESOPHAGOGASTRODUODENOSCOPY)  Patient Location: PACU and Endoscopy Unit  Anesthesia Type:General  Level of Consciousness: awake, alert , and oriented  Airway & Oxygen Therapy: Patient Spontanous Breathing and Patient connected to nasal cannula oxygen  Post-op Assessment: Report given to RN and Post -op Vital signs reviewed and stable  Post vital signs: Reviewed and stable  Last Vitals:  Vitals Value Taken Time  BP 129/53 10/19/24 13:10  Temp 36.3 C 10/19/24 13:04  Pulse 100 10/19/24 13:12  Resp 15 10/19/24 13:12  SpO2 97 % 10/19/24 13:12  Vitals shown include unfiled device data.  Last Pain:  Vitals:   10/19/24 1304  TempSrc: Temporal  PainSc: 5       Patients Stated Pain Goal: 0 (10/19/24 1304)  Complications: No notable events documented.

## 2024-10-19 NOTE — Op Note (Signed)
 [facility] Patient Name: Darlene Benton Procedure Date : 10/19/2024 MRN: [id number] Attending MD: Renaye Sous , MD, [id number] Date of Birth: Nov 09, 1971 CSN: [id number] Age: 53 Admit Type: Emergency Department Procedure:                Diagnostic EGD. Indications:              Dysphagia, Food impaction-body in the esophagus,                            History of EoE diagnosed in 2024. Providers:                Renaye Sous, MD, Ozell Pouch, Chyrl Crass                            CRNA, [facility] [place], Technician, Juliene Clinton,                            MD Referring MD:             Charlies RONAL Bellini, DO Medicines:                Monitored Anesthesia Care Complications:            No immediate complications. Estimated Blood Loss:     Estimated blood loss was minimal. Procedure:                Pre-Anesthesia Assessment: - Prior to the                            procedure, a history and physical was performed,                            and patient medications and allergies were                            reviewed. The patient's tolerance of previous                            anesthesia was also reviewed. The risks and                            benefits of the procedure and the sedation options                            and risks were discussed with the patient. All                            questions were answered, and informed consent was                            obtained. Prior Anticoagulants: The patient has                            taken no anticoagulant or antiplatelet agents. ASA  Grade Assessment: II - A patient with mild systemic                            disease. After reviewing the risks and benefits,                            the patient was deemed in satisfactory condition to                            undergo the procedure. After obtaining informed                            consent, the endoscope was passed under  direct                            vision. Throughout the procedure, the patient's                            blood pressure, pulse, and oxygen saturations were                            monitored continuously. The [id number]-[id number] ([id number])                            Olympus endoscope was introduced through the mouth,                            and advanced to the second part of duodenum. The                            EGD was accomplished without difficulty. The                            patient tolerated the procedure well. Scope In: Scope Out: Findings:      LA Grade D (one or more mucosal breaks involving at least 75% of       esophageal circumference) esophagitis with bleeding was found in the       distal esophagus/GEJ .      Mucosal changes including longitudinal furrows and white plaques were       found in the upper third of the esophagus, in the middle third of the       esophagus and in the lower third of the esophagus. Esophageal findings       were graded using the Eosinophilic Esophagitis Endoscopic Reference       Score (EoE-EREFS) as: Edema Grade 0 Normal (distinct vascular markings),       Rings Grade 0 None (no ridges or rings seen), Exudates Grade 1 Mild       (scattered white lesions involving less than 10 percent of the       esophageal surface area) and Furrows Grade 1 Mild (vertical lines       without visible depth).      The entire examined stomach was normal; a meat bolus was noted in the       cardia.      The examined  duodenum was normal.      The cardia and gastric fundus were normal on retroflexion. Impression:               - LA Grade D reflux esophagitis with bleeding in                            the distal esophagus.                           - Esophageal mucosal changes suggestive of                            Eosinophilic Esophagitis.                           - Normal appearing stomach.                           - Normal examined duodenum.                            - No specimens collected. Recommendation:           - Soft diet for 2 weeks.                           - Continue present medications.                           - Start Voquenza 20 mg PO daily for 8 weeks.                           - Return to my office in 1 week. Procedure Code(s):        --- Professional ---                           [phone], Esophagogastroduodenoscopy, flexible,                            transoral; diagnostic, including collection of                            specimen(s) by brushing or washing, when performed                            (separate procedure) Diagnosis Code(s):        --- Professional ---                           K21.01, Gastro-esophageal reflux disease with                            esophagitis, with bleeding                           K22.89, Other specified disease of esophagus  R13.10, Dysphagia, unspecified                           T18.108A, Unspecified foreign body in esophagus                            causing other injury, initial encounter CPT copyright 2022 [employer]. All rights reserved. The codes documented in this report are preliminary and upon coder review may  be revised to meet current compliance requirements. Renaye Sous, MD Renaye Sous, MD 10/19/2024 1:16:30 PM This report has been signed electronically. Number of Addenda: 0

## 2024-10-19 NOTE — ED Notes (Signed)
I gave patient a cup of water 

## 2024-10-19 NOTE — Consult Note (Signed)
 Reason for Consult:Food impactio; history of EoE. Referring Physician: ER MD Darlene Benton is an 53 y.o. female.  HPI: 53 year old white female with a history of EoE ate a piece of pork with bread last evening at 5 PM but could not get it down. She has a history of eosinophilic esophagitis diagnosed several years ago and has not taken her medications on a regular basis. She feels the inhaler makes her jittery.  She has a history of having a food impaction about once a month and the bolus passes spontaneously most of the time.  But this time in the emergency room she could not even keep liquids down when she was offered with some water.  Therefore plans were to do an emergent endoscopy.    Past Medical History:  Diagnosis Date   Allergic rhinitis    Allergy    Breast mass    Chicken pox    Eczema    Family history of adverse reaction to anesthesia    Mom and brother have hx of PONV   FH: deafness or hearing loss 08/03/2016   Headache    Hospitalize, right parotid questionable enlargement, MRI/CT and ultrasound completed was believed to be normal.   Major depression    Malaise    Migraines    Sleep disturbance 09/30/2020   Vitamin D  deficiency    Past Surgical History:  Procedure Laterality Date   WISDOM TOOTH EXTRACTION     Family History  Problem Relation Age of Onset   Cancer Mother        adenoid  cystic carcinoma    Osteoporosis Mother    Osteosclerosis Mother        hearing loss in early 75s   Alcohol abuse Father    Arthritis Father    Cancer Father    Diabetes Father    Hearing loss Father    Heart disease Father    Alcohol abuse Maternal Aunt    Alcohol abuse Maternal Uncle    Alcohol abuse Paternal Uncle    Social History:  reports that she has never smoked. She has never used smokeless tobacco. She reports current alcohol use of about 2.0 standard drinks of alcohol per week. She reports that she does not use drugs.  Allergies:  Allergies  Allergen Reactions    Latex Other (See Comments)   Benadryl [Diphenhydramine] Anxiety    Paradoxical reaction   Medications: I have reviewed the patient's current medications. Prior to Admission:  Medications Prior to Admission  Medication Sig Dispense Refill Last Dose/Taking   cetirizine (ZYRTEC) 10 MG tablet Take 10 mg by mouth daily as needed.       Cholecalciferol (VITAMIN D3) 125 MCG (5000 UT) CAPS Take by mouth.      EPINEPHrine 0.3 mg/0.3 mL IJ SOAJ injection U UTD      estradiol (VIVELLE-DOT) 0.05 MG/24HR patch APPLY 1 PATCH TOPICALLY TO THE SKIN 2 TIMES A WEEK      loratadine (CLARITIN) 10 MG tablet Take 10 mg by mouth daily as needed.       montelukast (SINGULAIR) 10 MG tablet TK 1 T PO QD      mupirocin  ointment (BACTROBAN ) 2 % Apply to affected area 1-2 x daily (Patient not taking: Reported on 11/29/2022) 22 g 0    triamcinolone  cream (KENALOG ) 0.1 % Apply 1 Application topically 2 (two) times daily. 80 g 5    venlafaxine  XR (EFFEXOR  XR) 37.5 MG 24 hr capsule Take 1 capsule (37.5  mg total) by mouth daily with breakfast. 90 capsule 1    Scheduled: Continuous:  sodium chloride      PRN:  Results for orders placed or performed during the hospital encounter of 10/19/24 (from the past 48 hours)  Basic metabolic panel     Status: Abnormal   Collection Time: 10/19/24  5:29 AM  Result Value Ref Range   Sodium 138 135 - 145 mmol/L   Potassium 3.4 (L) 3.5 - 5.1 mmol/L   Chloride 103 98 - 111 mmol/L   CO2 23 22 - 32 mmol/L   Glucose, Bld 96 70 - 99 mg/dL    Comment: Glucose reference range applies only to samples taken after fasting for at least 8 hours.   BUN 9 6 - 20 mg/dL   Creatinine, Ser 9.25 0.44 - 1.00 mg/dL   Calcium 8.5 (L) 8.9 - 10.3 mg/dL   GFR, Estimated >39 >39 mL/min    Comment: (NOTE) Calculated using the CKD-EPI Creatinine Equation (2021)    Anion gap 12 5 - 15    Comment: Performed at [facility], [address], [place], [place] [address]  CBC with Differential     Status:  None   Collection Time: 10/19/24  5:29 AM  Result Value Ref Range   WBC 5.0 4.0 - 10.5 K/uL   RBC 4.17 3.87 - 5.11 MIL/uL   Hemoglobin 13.3 12.0 - 15.0 g/dL   HCT 61.0 63.9 - 53.9 %   MCV 93.3 80.0 - 100.0 fL   MCH 31.9 26.0 - 34.0 pg   MCHC 34.2 30.0 - 36.0 g/dL   RDW 87.7 88.4 - 84.4 %   Platelets 264 150 - 400 K/uL   nRBC 0.0 0.0 - 0.2 %   Neutrophils Relative % 44 %   Neutro Abs 2.2 1.7 - 7.7 K/uL   Lymphocytes Relative 34 %   Lymphs Abs 1.7 0.7 - 4.0 K/uL   Monocytes Relative 12 %   Monocytes Absolute 0.6 0.1 - 1.0 K/uL   Eosinophils Relative 9 %   Eosinophils Absolute 0.5 0.0 - 0.5 K/uL   Basophils Relative 1 %   Basophils Absolute 0.0 0.0 - 0.1 K/uL   Immature Granulocytes 0 %   Abs Immature Granulocytes 0.01 0.00 - 0.07 K/uL    Comment: Performed at [facility], [address], [place], [place] [address]  Review of Systems  Constitutional:  Negative for activity change, appetite change, chills, diaphoresis, fatigue, fever and unexpected weight change.  HENT: Negative.    Eyes: Negative.   Respiratory: Negative.    Cardiovascular: Negative.   Gastrointestinal: Negative.   Endocrine: Negative.   Genitourinary: Negative.   Musculoskeletal: Negative.   Skin: Negative.   Allergic/Immunologic: Negative.   Neurological:  Positive for headaches.  Hematological: Negative.   Psychiatric/Behavioral:  Positive for dysphoric mood.    Blood pressure 121/69, pulse 82, temperature (!) 97.5 F (36.4 C), temperature source Temporal, resp. rate 11, height 5' 6 (1.676 m), weight 70.3 kg, SpO2 100%. Physical Exam Constitutional:      Appearance: Normal appearance.  HENT:     Head: Normocephalic and atraumatic.     Mouth/Throat:     Mouth: Mucous membranes are moist.  Eyes:     Extraocular Movements: Extraocular movements intact.     Pupils: Pupils are equal, round, and reactive to light.  Cardiovascular:     Rate and Rhythm: Normal rate and regular rhythm.   Pulmonary:     Effort:  Pulmonary effort is normal.     Breath sounds: Normal breath sounds.  Musculoskeletal:     Cervical back: Normal range of motion and neck supple.  Skin:    General: Skin is warm and dry.  Neurological:     General: No focal deficit present.     Mental Status: She is alert and oriented to person, place, and time.    Assessment/Plan: Food impaction-history of EoE-proceed with an EGD at this time.  Renaye Sous 10/19/2024, 12:33 PM

## 2024-10-19 NOTE — Anesthesia Preprocedure Evaluation (Signed)
 Anesthesia Evaluation  Patient identified by MRN, date of birth, ID band Patient awake    Reviewed: Allergy & Precautions, H&P , NPO status , Patient's Chart, lab work & pertinent test results  Airway Mallampati: II   Neck ROM: full    Dental   Pulmonary    breath sounds clear to auscultation       Cardiovascular negative cardio ROS  Rhythm:regular Rate:Normal     Neuro/Psych  Headaches PSYCHIATRIC DISORDERS Anxiety Depression       GI/Hepatic Food impaction   Endo/Other    Renal/GU      Musculoskeletal   Abdominal   Peds  Hematology   Anesthesia Other Findings   Reproductive/Obstetrics                              Anesthesia Physical Anesthesia Plan  ASA: 2  Anesthesia Plan: General   Post-op Pain Management:    Induction: Intravenous, Rapid sequence and Cricoid pressure planned  PONV Risk Score and Plan: 3 and Ondansetron , Dexamethasone , Midazolam  and Treatment may vary due to age or medical condition  Airway Management Planned: Oral ETT  Additional Equipment:   Intra-op Plan:   Post-operative Plan: Extubation in OR  Informed Consent: I have reviewed the patients History and Physical, chart, labs and discussed the procedure including the risks, benefits and alternatives for the proposed anesthesia with the patient or authorized representative who has indicated his/her understanding and acceptance.     Dental advisory given  Plan Discussed with: CRNA, Anesthesiologist and Surgeon  Anesthesia Plan Comments:         Anesthesia Quick Evaluation

## 2024-10-19 NOTE — ED Notes (Signed)
 Pt unable to tolerate PO water intake.

## 2024-10-19 NOTE — ED Notes (Signed)
Provided patient with a cup of water.

## 2024-10-22 ENCOUNTER — Encounter (HOSPITAL_COMMUNITY): Payer: Self-pay | Admitting: Gastroenterology
